# Patient Record
Sex: Female | Born: 1975 | Race: White | Hispanic: No | Marital: Single | Smoking: Current every day smoker
Health system: Southern US, Community
[De-identification: ages and names within clinical notes are randomized; demographics above are authoritative.]

## PROBLEM LIST (undated history)

## (undated) DIAGNOSIS — F32A Depression, unspecified: Secondary | ICD-10-CM

## (undated) DIAGNOSIS — F419 Anxiety disorder, unspecified: Secondary | ICD-10-CM

## (undated) DIAGNOSIS — F329 Major depressive disorder, single episode, unspecified: Secondary | ICD-10-CM

## (undated) DIAGNOSIS — N200 Calculus of kidney: Secondary | ICD-10-CM

## (undated) DIAGNOSIS — F4310 Post-traumatic stress disorder, unspecified: Secondary | ICD-10-CM

## (undated) DIAGNOSIS — N390 Urinary tract infection, site not specified: Secondary | ICD-10-CM

## (undated) HISTORY — PX: LAPAROSCOPIC ENDOMETRIOSIS FULGURATION: SUR769

## (undated) HISTORY — PX: TUBAL LIGATION: SHX77

## (undated) HISTORY — PX: HYSTEROSCOPY W/ ENDOMETRIAL ABLATION: SUR665

## (undated) HISTORY — PX: BILATERAL CARPAL TUNNEL RELEASE: SHX6508

## (undated) HISTORY — DX: Anxiety disorder, unspecified: F41.9

---

## 2001-12-12 ENCOUNTER — Emergency Department (HOSPITAL_COMMUNITY): Admission: EM | Admit: 2001-12-12 | Discharge: 2001-12-12 | Payer: Self-pay | Admitting: Emergency Medicine

## 2001-12-12 ENCOUNTER — Encounter: Payer: Self-pay | Admitting: Emergency Medicine

## 2002-02-19 ENCOUNTER — Emergency Department (HOSPITAL_COMMUNITY): Admission: EM | Admit: 2002-02-19 | Discharge: 2002-02-19 | Payer: Self-pay | Admitting: Internal Medicine

## 2002-04-10 ENCOUNTER — Emergency Department (HOSPITAL_COMMUNITY): Admission: EM | Admit: 2002-04-10 | Discharge: 2002-04-10 | Payer: Self-pay | Admitting: Emergency Medicine

## 2002-04-23 ENCOUNTER — Emergency Department (HOSPITAL_COMMUNITY): Admission: EM | Admit: 2002-04-23 | Discharge: 2002-04-24 | Payer: Self-pay | Admitting: *Deleted

## 2006-09-16 ENCOUNTER — Emergency Department (HOSPITAL_COMMUNITY): Admission: EM | Admit: 2006-09-16 | Discharge: 2006-09-16 | Payer: Self-pay | Admitting: Emergency Medicine

## 2006-10-27 ENCOUNTER — Emergency Department (HOSPITAL_COMMUNITY): Admission: EM | Admit: 2006-10-27 | Discharge: 2006-10-27 | Payer: Self-pay | Admitting: Emergency Medicine

## 2006-12-07 ENCOUNTER — Emergency Department (HOSPITAL_COMMUNITY): Admission: EM | Admit: 2006-12-07 | Discharge: 2006-12-07 | Payer: Self-pay | Admitting: Emergency Medicine

## 2007-05-24 ENCOUNTER — Emergency Department (HOSPITAL_COMMUNITY): Admission: EM | Admit: 2007-05-24 | Discharge: 2007-05-24 | Payer: Self-pay | Admitting: Emergency Medicine

## 2008-04-01 ENCOUNTER — Emergency Department (HOSPITAL_COMMUNITY): Admission: EM | Admit: 2008-04-01 | Discharge: 2008-04-01 | Payer: Self-pay | Admitting: Emergency Medicine

## 2008-12-08 ENCOUNTER — Emergency Department (HOSPITAL_COMMUNITY): Admission: EM | Admit: 2008-12-08 | Discharge: 2008-12-08 | Payer: Self-pay | Admitting: Emergency Medicine

## 2010-09-11 ENCOUNTER — Encounter: Payer: Self-pay | Admitting: *Deleted

## 2010-09-11 ENCOUNTER — Emergency Department (HOSPITAL_COMMUNITY)
Admission: EM | Admit: 2010-09-11 | Discharge: 2010-09-11 | Disposition: A | Discharge status: Home / Self Care | Payer: Self-pay | Attending: Emergency Medicine | Admitting: Emergency Medicine

## 2010-09-11 DIAGNOSIS — N739 Female pelvic inflammatory disease, unspecified: Secondary | ICD-10-CM | POA: Insufficient documentation

## 2010-09-11 DIAGNOSIS — N390 Urinary tract infection, site not specified: Secondary | ICD-10-CM | POA: Insufficient documentation

## 2010-09-11 DIAGNOSIS — H00019 Hordeolum externum unspecified eye, unspecified eyelid: Secondary | ICD-10-CM | POA: Insufficient documentation

## 2010-09-11 DIAGNOSIS — R109 Unspecified abdominal pain: Secondary | ICD-10-CM | POA: Insufficient documentation

## 2010-09-11 DIAGNOSIS — N898 Other specified noninflammatory disorders of vagina: Secondary | ICD-10-CM | POA: Insufficient documentation

## 2010-09-11 DIAGNOSIS — H5710 Ocular pain, unspecified eye: Secondary | ICD-10-CM | POA: Insufficient documentation

## 2010-09-11 LAB — URINALYSIS, ROUTINE W REFLEX MICROSCOPIC
Bilirubin Urine: NEGATIVE
Glucose, UA: NEGATIVE mg/dL
Hgb urine dipstick: NEGATIVE
Ketones, ur: NEGATIVE mg/dL
Leukocytes, UA: NEGATIVE
Nitrite: NEGATIVE
Protein, ur: NEGATIVE mg/dL
Specific Gravity, Urine: 1.02 (ref 1.005–1.030)
Urobilinogen, UA: 0.2 mg/dL (ref 0.0–1.0)
pH: 7.5 (ref 5.0–8.0)

## 2010-09-11 LAB — POCT PREGNANCY, URINE: Preg Test, Ur: NEGATIVE

## 2010-09-11 LAB — WET PREP, GENITAL: Yeast Wet Prep HPF POC: NONE SEEN

## 2010-09-11 MED ORDER — DOXYCYCLINE HYCLATE 100 MG PO CAPS: 100.0000 mg | ORAL_CAPSULE | Freq: Two times a day (BID) | ORAL | Status: AC

## 2010-09-11 MED ORDER — METRONIDAZOLE 500 MG PO TABS: 500.0000 mg | ORAL_TABLET | Freq: Two times a day (BID) | ORAL | Status: AC

## 2010-09-11 MED ORDER — HYDROCODONE-ACETAMINOPHEN 5-325 MG PO TABS: ORAL_TABLET | ORAL | Status: AC

## 2010-09-11 MED ORDER — CEFTRIAXONE SODIUM 1 G IJ SOLR
500.0000 mg | Freq: Once | INTRAMUSCULAR | Status: AC
  Administered 2010-09-11: 500 mg via INTRAMUSCULAR

## 2010-09-11 MED ORDER — TOBRAMYCIN 0.3 % OP SOLN
2.0000 [drp] | Freq: Once | OPHTHALMIC | Status: AC
  Administered 2010-09-11: 2 [drp] via OPHTHALMIC

## 2010-09-11 NOTE — ED Notes (Signed)
Set pt up for pelvic exam.

## 2010-09-11 NOTE — ED Notes (Signed)
Pt c/o lower abd pain and dark urine also vaginal discharge. Pt c/o right eye irritation with drainage.

## 2010-09-11 NOTE — ED Notes (Signed)
Pt reports lower abd pain for the past few days.  Denies any n/v/d.  Reports has had a white colored vaginal discharge but says is not new.  LBM was this am and was normal per pt.  Abd soft, bowel sounds present.  PT also c/o pain in R eye for the past couple of days.

## 2010-09-11 NOTE — ED Provider Notes (Signed)
History     CSN: [id number] Arrival date & time: 09/11/2010 11:05 AM  Chief Complaint  Patient presents with  . Eye Pain    right eye  . Urinary Tract Infection    possible, urine dark  . Abdominal Pain    lower abd pain   HPI Comments: Patient c/o crampy type lower abd pain for several days.  Does not recall anything that makes it worse.  Also c/o recurrent vagianl discharge that is heavier at times.  She denies fever N/V/D.  Also denies dysuria sx's but does state that her urines has been darker than ususal and she also reports pain and swelling to her right lower eyelid for 2 days.  She denies visual changes or crusting of her eye.  Patient is a 35 y.o. female presenting with abdominal pain. The history is provided by the patient.  Abdominal Pain The primary symptoms of the illness include abdominal pain and vaginal discharge. The primary symptoms of the illness do not include fever, shortness of breath, nausea, vomiting, diarrhea, hematochezia, dysuria or vaginal bleeding. The current episode started more than 2 days ago. The onset of the illness was gradual. The problem has not changed since onset. The abdominal pain is located in the suprapubic region. The abdominal pain does not radiate. The abdominal pain is relieved by nothing. Exacerbated by: nothing.  The vaginal discharge was first noticed more than 2 days ago. Vaginal discharge is a recurrent problem. The patient believes that the vaginal discharge is unchanged since it began. The amount of discharge is variable. The color of the discharge is white. The vaginal discharge is associated with burning. The vaginal discharge is not associated with itching or dysuria.  The patient states that she believes she is currently not pregnant. The patient has not had a change in bowel habit. Symptoms associated with the illness do not include chills, anorexia, constipation, urgency, hematuria, frequency or back pain. Significant associated medical  issues do not include diabetes.    Past Medical History  Diagnosis Date  . Endometriosis     Past Surgical History  Procedure Date  . Laparoscopic endometriosis fulguration     History reviewed. No pertinent family history.  History  Substance Use Topics  . Smoking status: Current Everyday Smoker -- 1.0 packs/day  . Smokeless tobacco: Not on file  . Alcohol Use: Yes     occationally    OB History    Grav Para Term Preterm Abortions TAB SAB Ect Mult Living                  Review of Systems  Constitutional: Negative for fever and chills.  Eyes: Positive for pain. Negative for photophobia, discharge, redness, itching and visual disturbance.  Respiratory: Negative for chest tightness and shortness of breath.   Gastrointestinal: Positive for abdominal pain. Negative for nausea, vomiting, diarrhea, constipation, hematochezia and anorexia.  Genitourinary: Positive for vaginal discharge and pelvic pain. Negative for dysuria, urgency, frequency, hematuria and vaginal bleeding.  Musculoskeletal: Negative for back pain.  Skin: Negative for itching.  All other systems reviewed and are negative.    Physical Exam  BP 92/64  Pulse 92  Temp(Src) 98 F (36.7 C) (Oral)  Resp 18  Ht 4\' 11"  (1.499 m)  Wt 115 lb (52.164 kg)  BMI 23.23 kg/m2  SpO2 100%  LMP 08/28/2010  Physical Exam  Nursing note and vitals reviewed. Constitutional: She is oriented to person, place, and time. She appears well-developed and well-nourished. No  distress.  HENT:  Head: Normocephalic and atraumatic.  Right Ear: External ear normal.  Left Ear: External ear normal.  Mouth/Throat: Oropharynx is clear and moist.  Eyes: EOM are normal. Pupils are equal, round, and reactive to light. Right eye exhibits hordeolum. Right conjunctiva is not injected. Right conjunctiva has no hemorrhage.    Neck: Normal range of motion. Neck supple.  Cardiovascular: Normal rate, regular rhythm and normal heart sounds.     Abdominal: Soft. She exhibits no distension.  Genitourinary: Uterus normal. Cervix exhibits motion tenderness and discharge. Cervix exhibits no friability. Right adnexum displays no mass and no tenderness. Left adnexum displays no mass and no tenderness. No tenderness or bleeding around the vagina. Vaginal discharge found.  Musculoskeletal: She exhibits no edema and no tenderness.  Lymphadenopathy:    She has no cervical adenopathy.  Neurological: She is alert and oriented to person, place, and time.  Skin: Skin is warm and dry.  Psychiatric: She has a normal mood and affect.    ED Course  Procedures  MDM   1315  GC and chlamydia culture pending.  Pt is ambulatory, NAD.  Vitals are stable.  Non-toxic appearing.  Pt has CMT and mild to moderate vaginal d/c.  No adnexal tenderness or masses on exam.  Will treat for PID.   Pt has appt with her PMD for Monday.        Jannet Calip L. [place], [place] 09/19/10 [phone]

## 2010-09-21 NOTE — ED Provider Notes (Signed)
Medical screening examination/treatment/procedure(s) were performed by non-physician practitioner and as supervising physician I was immediately available for consultation/collaboration.   Lyanne Co, MD 09/21/10 [phone]

## 2010-10-28 LAB — URINALYSIS, ROUTINE W REFLEX MICROSCOPIC
Bilirubin Urine: NEGATIVE
Glucose, UA: NEGATIVE
Hgb urine dipstick: NEGATIVE
Ketones, ur: NEGATIVE
Nitrite: NEGATIVE
Protein, ur: NEGATIVE
Specific Gravity, Urine: 1.015
Urobilinogen, UA: 0.2
pH: 7

## 2010-10-28 LAB — PREGNANCY, URINE: Preg Test, Ur: NEGATIVE

## 2010-10-28 LAB — GC/CHLAMYDIA PROBE AMP, GENITAL
Chlamydia, DNA Probe: NEGATIVE
GC Probe Amp, Genital: NEGATIVE

## 2010-10-28 LAB — WET PREP, GENITAL
Clue Cells Wet Prep HPF POC: NONE SEEN
Trich, Wet Prep: NONE SEEN
Yeast Wet Prep HPF POC: NONE SEEN

## 2010-11-13 LAB — URINALYSIS, ROUTINE W REFLEX MICROSCOPIC
Bilirubin Urine: NEGATIVE
Glucose, UA: NEGATIVE
Nitrite: NEGATIVE
Specific Gravity, Urine: 1.01
pH: 6.5

## 2010-11-13 LAB — PREGNANCY, URINE: Preg Test, Ur: NEGATIVE

## 2010-11-13 LAB — GC/CHLAMYDIA PROBE AMP, GENITAL: Chlamydia, DNA Probe: NEGATIVE

## 2010-11-17 LAB — COMPREHENSIVE METABOLIC PANEL
Albumin: 4.3
Alkaline Phosphatase: 67
BUN: 6
CO2: 29
Chloride: 103
Glucose, Bld: 96
Potassium: 4.2
Total Bilirubin: 0.5

## 2010-11-17 LAB — DIFFERENTIAL
Basophils Absolute: 0
Basophils Relative: 1
Monocytes Absolute: 0.7
Neutro Abs: 3.3
Neutrophils Relative %: 48

## 2010-11-17 LAB — CBC
HCT: 38.1
Hemoglobin: 13.1
WBC: 6.9

## 2010-11-17 LAB — URINALYSIS, ROUTINE W REFLEX MICROSCOPIC
Bilirubin Urine: NEGATIVE
Ketones, ur: NEGATIVE
Nitrite: NEGATIVE
Specific Gravity, Urine: 1.025
Urobilinogen, UA: 0.2

## 2010-11-17 LAB — WET PREP, GENITAL

## 2010-11-17 LAB — PREGNANCY, URINE: Preg Test, Ur: NEGATIVE

## 2011-07-20 ENCOUNTER — Emergency Department (HOSPITAL_COMMUNITY)
Admission: EM | Admit: 2011-07-20 | Discharge: 2011-07-21 | Disposition: A | Discharge status: Home / Self Care | Payer: Self-pay | Attending: Emergency Medicine | Admitting: Emergency Medicine

## 2011-07-20 ENCOUNTER — Emergency Department (HOSPITAL_COMMUNITY): Payer: Self-pay

## 2011-07-20 ENCOUNTER — Encounter (HOSPITAL_COMMUNITY): Payer: Self-pay | Admitting: *Deleted

## 2011-07-20 DIAGNOSIS — R300 Dysuria: Secondary | ICD-10-CM | POA: Insufficient documentation

## 2011-07-20 DIAGNOSIS — F17200 Nicotine dependence, unspecified, uncomplicated: Secondary | ICD-10-CM | POA: Insufficient documentation

## 2011-07-20 DIAGNOSIS — N200 Calculus of kidney: Secondary | ICD-10-CM | POA: Insufficient documentation

## 2011-07-20 DIAGNOSIS — R102 Pelvic and perineal pain: Secondary | ICD-10-CM

## 2011-07-20 DIAGNOSIS — R109 Unspecified abdominal pain: Secondary | ICD-10-CM | POA: Insufficient documentation

## 2011-07-20 DIAGNOSIS — N949 Unspecified condition associated with female genital organs and menstrual cycle: Secondary | ICD-10-CM | POA: Insufficient documentation

## 2011-07-20 DIAGNOSIS — R0602 Shortness of breath: Secondary | ICD-10-CM | POA: Insufficient documentation

## 2011-07-20 DIAGNOSIS — N898 Other specified noninflammatory disorders of vagina: Secondary | ICD-10-CM | POA: Insufficient documentation

## 2011-07-20 LAB — CBC
HCT: 38.9 % (ref 36.0–46.0)
Hemoglobin: 13.1 g/dL (ref 12.0–15.0)
MCH: 31.3 pg (ref 26.0–34.0)
MCHC: 33.7 g/dL (ref 30.0–36.0)
MCV: 93.1 fL (ref 78.0–100.0)
RBC: 4.18 MIL/uL (ref 3.87–5.11)

## 2011-07-20 LAB — WET PREP, GENITAL: Yeast Wet Prep HPF POC: NONE SEEN

## 2011-07-20 LAB — URINALYSIS, ROUTINE W REFLEX MICROSCOPIC
Bilirubin Urine: NEGATIVE
Nitrite: NEGATIVE
Specific Gravity, Urine: 1.02 (ref 1.005–1.030)
Urobilinogen, UA: 0.2 mg/dL (ref 0.0–1.0)

## 2011-07-20 LAB — DIFFERENTIAL
Basophils Relative: 0 % (ref 0–1)
Eosinophils Absolute: 0.1 10*3/uL (ref 0.0–0.7)
Eosinophils Relative: 2 % (ref 0–5)
Lymphs Abs: 2.2 10*3/uL (ref 0.7–4.0)
Monocytes Absolute: 0.6 10*3/uL (ref 0.1–1.0)
Monocytes Relative: 11 % (ref 3–12)

## 2011-07-20 LAB — BASIC METABOLIC PANEL
BUN: 16 mg/dL (ref 6–23)
Calcium: 9.6 mg/dL (ref 8.4–10.5)
Creatinine, Ser: 0.56 mg/dL (ref 0.50–1.10)
GFR calc Af Amer: >90 mL/min (ref >90)
GFR calc non Af Amer: >90 mL/min (ref >90)
Glucose, Bld: 102 mg/dL — ABNORMAL HIGH (ref 70–99)
Potassium: 3.9 mEq/L (ref 3.5–5.1)

## 2011-07-20 MED ORDER — HYDROMORPHONE HCL PF 1 MG/ML IJ SOLN
1.0000 mg | Freq: Once | INTRAMUSCULAR | Status: AC
  Administered 2011-07-20: 1 mg via INTRAVENOUS
  Filled 2011-07-20: qty 1

## 2011-07-20 MED ORDER — IOHEXOL 300 MG/ML  SOLN
100.0000 mL | Freq: Once | INTRAMUSCULAR | Status: AC | PRN
  Administered 2011-07-20: 100 mL via INTRAVENOUS

## 2011-07-20 MED ORDER — SODIUM CHLORIDE 0.9 % IV BOLUS (SEPSIS)
250.0000 mL | Freq: Once | INTRAVENOUS | Status: AC
  Administered 2011-07-20: 250 mL via INTRAVENOUS

## 2011-07-20 MED ORDER — SODIUM CHLORIDE 0.9 % IV SOLN: INTRAVENOUS | Status: DC

## 2011-07-20 MED ORDER — ONDANSETRON HCL 4 MG/2ML IJ SOLN
4.0000 mg | Freq: Once | INTRAMUSCULAR | Status: AC
  Administered 2011-07-20: 4 mg via INTRAVENOUS
  Filled 2011-07-20: qty 2

## 2011-07-20 NOTE — ED Provider Notes (Signed)
History     CSN: [id number]  Arrival date & time 07/20/11  1439   First MD Initiated Contact with Patient 07/20/11 1810      Chief Complaint  Patient presents with  . Abdominal Pain    (Consider location/radiation/quality/duration/timing/severity/associated sxs/prior treatment) Patient is a 36 y.o. female presenting with abdominal pain. The history is provided by the patient.  Abdominal Pain The primary symptoms of the illness include abdominal pain, shortness of breath, dysuria and vaginal discharge. The primary symptoms of the illness do not include fever, fatigue, nausea, vomiting, diarrhea or vaginal bleeding. The current episode started more than 2 days ago. The onset of the illness was sudden. The problem has been gradually worsening.  The abdominal pain began more than 2 days ago (abdominal pain for 3-4 days.). The abdominal pain is located in the suprapubic region, RLQ and LLQ. The abdominal pain radiates to the back. The severity of the abdominal pain is 8/10. The abdominal pain is relieved by nothing.  The dysuria began 3 to 5 days ago. The discomfort is felt in the suprapubic area. The dysuria is associated with discharge. The dysuria is not associated with hematuria.  The vaginal discharge is associated with dysuria.   The patient states that she believes she is currently not pregnant. Symptoms associated with the illness do not include hematuria or back pain.  past medical history significant for endometriosis.  Past Medical History  Diagnosis Date  . Endometriosis     Past Surgical History  Procedure Date  . Laparoscopic endometriosis fulguration   . Cesarean section   . Tubal ligation     History reviewed. No pertinent family history.  History  Substance Use Topics  . Smoking status: Current Everyday Smoker -- 1.0 packs/day  . Smokeless tobacco: Not on file  . Alcohol Use: Yes     occationally    OB History    Grav Para Term Preterm Abortions TAB SAB Ect  Mult Living                  Review of Systems  Constitutional: Negative for fever and fatigue.  HENT: Negative for neck pain.   Respiratory: Positive for shortness of breath. Negative for cough.   Cardiovascular: Negative for chest pain.  Gastrointestinal: Positive for abdominal pain. Negative for nausea, vomiting and diarrhea.  Genitourinary: Positive for dysuria and vaginal discharge. Negative for hematuria and vaginal bleeding.  Musculoskeletal: Negative for back pain.  Skin: Negative for rash.  Neurological: Negative for headaches.  Hematological: Does not bruise/bleed easily.    Allergies  Review of patient's allergies indicates no known allergies.  Home Medications   Current Outpatient Rx  Name Route Sig Dispense Refill  . IBUPROFEN 200 MG PO TABS Oral Take 600 mg by mouth every 6 (six) hours as needed. FOR PAIN     . NAPROXEN SODIUM 220 MG PO CAPS Oral Take 440 mg by mouth as needed. For pain    . HYDROCODONE-ACETAMINOPHEN 5-325 MG PO TABS Oral Take 1 tablet by mouth every 4 (four) hours as needed for pain. 10 tablet 0    BP 94/55  Pulse 63  Temp 98.1 F (36.7 C) (Oral)  Resp 16  Ht 4\' 11"  (1.499 m)  Wt 110 lb (49.896 kg)  BMI 22.22 kg/m2  SpO2 100%  LMP 07/11/2011  Physical Exam  Nursing note and vitals reviewed. Constitutional: She is oriented to person, place, and time. She appears well-developed and well-nourished. No distress.  HENT:  Head: Normocephalic and atraumatic.  Mouth/Throat: Oropharynx is clear and moist.  Eyes: Conjunctivae and EOM are normal. Pupils are equal, round, and reactive to light.  Neck: Normal range of motion. Neck supple.  Cardiovascular: Normal rate, regular rhythm and normal heart sounds.   No murmur heard. Pulmonary/Chest: Effort normal and breath sounds normal.  Abdominal: Soft. Bowel sounds are normal. There is no tenderness.  Genitourinary: Vaginal discharge found.       On vaginal exam external genitalia is normal no  bleeding slight cloudy discharge not purulent not milky mild cervical motion tenderness mild uterine discomfort no adnexal discomfort.  Musculoskeletal: Normal range of motion. She exhibits no edema.  Neurological: She is alert and oriented to person, place, and time. No cranial nerve deficit. She exhibits normal muscle tone. Coordination normal.  Skin: Skin is warm. No rash noted.    ED Course  Procedures (including critical care time)  Labs Reviewed  BASIC METABOLIC PANEL - Abnormal; Notable for the following:    Glucose, Bld 102 (*)     All other components within normal limits  WET PREP, GENITAL - Abnormal; Notable for the following:    Clue Cells Wet Prep HPF POC FEW (*)     WBC, Wet Prep HPF POC FEW (*)     All other components within normal limits  URINALYSIS, ROUTINE W REFLEX MICROSCOPIC  PREGNANCY, URINE  CBC  DIFFERENTIAL  GC/CHLAMYDIA PROBE AMP, GENITAL   Ct Abdomen Pelvis W Contrast  07/20/2011  *RADIOLOGY REPORT*  Clinical Data: Diffuse abdominal pain.  Endometriosis.  CT ABDOMEN AND PELVIS WITH CONTRAST  Technique:  Multidetector CT imaging of the abdomen and pelvis was performed following the standard protocol during bolus administration of intravenous contrast.  Contrast: OMNIPAQUE IOHEXOL 300 MG/ML  SOLN  Comparison: None.  Findings: The abdominal parenchymal organs are normal in appearance.  No soft tissue masses or lymphadenopathy identified within the abdomen or pelvis.  Gallbladder is unremarkable.  A tiny less than 5 mm nonobstructing calculus is seen in the mid pole of the left kidney, however there is no evidence of ureteral calculi or hydronephrosis.  Uterus and adnexa are unremarkable in appearance.  No evidence of inflammatory process or abnormal fluid collections within the abdomen or pelvis.  No evidence of bowel wall thickening or dilatation. Normal appendix is visualized.  IMPRESSION:  1.  Tiny nonobstructing left intrarenal calculus. 2.  No evidence of  ureteral calculus, hydronephrosis, or other acute findings.  Original Report Authenticated By: Danae Orleans, M.D.    Results for orders placed during the hospital encounter of 07/20/11  URINALYSIS, ROUTINE W REFLEX MICROSCOPIC      Component Value Range   Color, Urine YELLOW  YELLOW   APPearance CLEAR  CLEAR   Specific Gravity, Urine 1.020  1.005 - 1.030   pH 6.0  5.0 - 8.0   Glucose, UA NEGATIVE  NEGATIVE mg/dL   Hgb urine dipstick NEGATIVE  NEGATIVE   Bilirubin Urine NEGATIVE  NEGATIVE   Ketones, ur NEGATIVE  NEGATIVE mg/dL   Protein, ur NEGATIVE  NEGATIVE mg/dL   Urobilinogen, UA 0.2  0.0 - 1.0 mg/dL   Nitrite NEGATIVE  NEGATIVE   Leukocytes, UA NEGATIVE  NEGATIVE  PREGNANCY, URINE      Component Value Range   Preg Test, Ur NEGATIVE  NEGATIVE  CBC      Component Value Range   WBC 5.1  4.0 - 10.5 K/uL   RBC 4.18  3.87 - 5.11 MIL/uL  Hemoglobin 13.1  12.0 - 15.0 g/dL   HCT 82.9  56.2 - 13.0 %   MCV 93.1  78.0 - 100.0 fL   MCH 31.3  26.0 - 34.0 pg   MCHC 33.7  30.0 - 36.0 g/dL   RDW 86.5  78.4 - 69.6 %   Platelets 282  150 - 400 K/uL  DIFFERENTIAL      Component Value Range   Neutrophils Relative 44  43 - 77 %   Neutro Abs 2.3  1.7 - 7.7 K/uL   Lymphocytes Relative 43  12 - 46 %   Lymphs Abs 2.2  0.7 - 4.0 K/uL   Monocytes Relative 11  3 - 12 %   Monocytes Absolute 0.6  0.1 - 1.0 K/uL   Eosinophils Relative 2  0 - 5 %   Eosinophils Absolute 0.1  0.0 - 0.7 K/uL   Basophils Relative 0  0 - 1 %   Basophils Absolute 0.0  0.0 - 0.1 K/uL  BASIC METABOLIC PANEL      Component Value Range   Sodium 138  135 - 145 mEq/L   Potassium 3.9  3.5 - 5.1 mEq/L   Chloride 102  96 - 112 mEq/L   CO2 25  19 - 32 mEq/L   Glucose, Bld 102 (*) 70 - 99 mg/dL   BUN 16  6 - 23 mg/dL   Creatinine, Ser 2.95  0.50 - 1.10 mg/dL   Calcium 9.6  8.4 - 28.4 mg/dL   GFR calc non Af Amer >90  >90 mL/min   GFR calc Af Amer >90  >90 mL/min  WET PREP, GENITAL      Component Value Range   Yeast Wet  Prep HPF POC NONE SEEN  NONE SEEN   Trich, Wet Prep NONE SEEN  NONE SEEN   Clue Cells Wet Prep HPF POC FEW (*) NONE SEEN   WBC, Wet Prep HPF POC FEW (*) NONE SEEN    1. Pelvic pain       MDM  Patient presents with complaint of frequency of urination with bad odor pain low abdomen and back no nausea no vomiting no diarrhea urinalysis was negative for urinary tract infection on pelvic exam there was mild cervical motion tenderness and uterine tenderness but no adnexal tenderness very mild not exactly like PID. CT scan was order was originally planned to do a pelvic ultrasound that was not available CT scan is pending to rule out any other abdominal process or pelvic process. Patient does have a past history of endometriosis this may be related to that. Patient's wet prep without any specific findings urinalysis as stated not consistent with urinary tract infection no leukocytosis pregnancy test was negative.  If the CT scan is negative patient can be discharged home with a small amount of pain medicine and followup with [facility] for further evaluation.a few clue cells noted but do not feel that this is significant requiring treatment. Pelvis without a significant discharge and no bleeding.        Shelda Jakes, MD 07/21/11 1325

## 2011-07-20 NOTE — ED Notes (Signed)
Frequency of urination, with bad odor,  Pain low abd and back.  No nausea,  No diarrhea.Marland Kitchen

## 2011-07-21 MED ORDER — HYDROCODONE-ACETAMINOPHEN 5-325 MG PO TABS: 1.0000 | ORAL_TABLET | ORAL | Status: AC | PRN

## 2011-07-21 NOTE — Discharge Instructions (Signed)
Apply heat to the lower abdomen for comfort. Use the pain medicine as needed. Follow up with OB/GYN. One is listed for you on the paperwork.   Abdominal Pain, Women Abdominal (stomach, pelvic, or belly) pain can be caused by many things. It is important to tell your doctor:  The location of the pain.   Does it come and go or is it present all the time?   Are there things that start the pain (eating certain foods, exercise)?   Are there other symptoms associated with the pain (fever, nausea, vomiting, diarrhea)?  All of this is helpful to know when trying to find the cause of the pain. CAUSES   Stomach: virus or bacteria infection, or ulcer.   Intestine: appendicitis (inflamed appendix), regional ileitis (Crohn's disease), ulcerative colitis (inflamed colon), irritable bowel syndrome, diverticulitis (inflamed diverticulum of the colon), or cancer of the stomach or intestine.   Gallbladder disease or stones in the gallbladder.   Kidney disease, kidney stones, or infection.   Pancreas infection or cancer.   Fibromyalgia (pain disorder).   Diseases of the female organs:   Uterus: fibroid (non-cancerous) tumors or infection.   Fallopian tubes: infection or tubal pregnancy.   Ovary: cysts or tumors.   Pelvic adhesions (scar tissue).   Endometriosis (uterus lining tissue growing in the pelvis and on the pelvic organs).   Pelvic congestion syndrome (female organs filling up with blood just before the menstrual period).   Pain with the menstrual period.   Pain with ovulation (producing an egg).   Pain with an IUD (intrauterine device, birth control) in the uterus.   Cancer of the female organs.   Functional pain (pain not caused by a disease, may improve without treatment).   Psychological pain.   Depression.  DIAGNOSIS  Your doctor will decide the seriousness of your pain by doing an examination.  Blood tests.   X-rays.   Ultrasound.   CT scan (computed  tomography, special type of X-ray).   MRI (magnetic resonance imaging).   Cultures, for infection.   Barium enema (dye inserted in the large intestine, to better view it with X-rays).   Colonoscopy (looking in intestine with a lighted tube).   Laparoscopy (minor surgery, looking in abdomen with a lighted tube).   Major abdominal exploratory surgery (looking in abdomen with a large incision).  TREATMENT  The treatment will depend on the cause of the pain.   Many cases can be observed and treated at home.   Over-the-counter medicines recommended by your caregiver.   Prescription medicine.   Antibiotics, for infection.   Birth control pills, for painful periods or for ovulation pain.   Hormone treatment, for endometriosis.   Nerve blocking injections.   Physical therapy.   Antidepressants.   Counseling with a psychologist or psychiatrist.   Minor or major surgery.  HOME CARE INSTRUCTIONS   Do not take laxatives, unless directed by your caregiver.   Take over-the-counter pain medicine only if ordered by your caregiver. Do not take aspirin because it can cause an upset stomach or bleeding.   Try a clear liquid diet (broth or water) as ordered by your caregiver. Slowly move to a bland diet, as tolerated, if the pain is related to the stomach or intestine.   Have a thermometer and take your temperature several times a day, and record it.   Bed rest and sleep, if it helps the pain.   Avoid sexual intercourse, if it causes pain.   Avoid stressful  situations.   Keep your follow-up appointments and tests, as your caregiver orders.   If the pain does not go away with medicine or surgery, you may try:   Acupuncture.   Relaxation exercises (yoga, meditation).   Group therapy.   Counseling.  SEEK MEDICAL CARE IF:   You notice certain foods cause stomach pain.   Your home care treatment is not helping your pain.   You need stronger pain medicine.   You want  your IUD removed.   You feel faint or lightheaded.   You develop nausea and vomiting.   You develop a rash.   You are having side effects or an allergy to your medicine.  SEEK IMMEDIATE MEDICAL CARE IF:   Your pain does not go away or gets worse.   You have a fever.   Your pain is felt only in portions of the abdomen. The right side could possibly be appendicitis. The left lower portion of the abdomen could be colitis or diverticulitis.   You are passing blood in your stools (bright red or black tarry stools, with or without vomiting).   You have blood in your urine.   You develop chills, with or without a fever.   You pass out.  MAKE SURE YOU:   Understand these instructions.   Will watch your condition.   Will get help right away if you are not doing well or get worse.  Document Released: 11/16/2006 Document Revised: 01/08/2011 Document Reviewed: 12/06/2008 [facility] Patient Information 2012 [place], [place].

## 2011-07-21 NOTE — Progress Notes (Signed)
2300 Assumed care/disposition of patient with lower abdominal pain  Associated with urinary frequency.Awaiting CT abdomen and pelvis. Labs are unremarkable. Urine with a few clue cells.  1610 CT scan is negative for any acute process. Reviewed results with the patient. Her pain is better. She asked for work note and Rx for analgesics. Referral to OB/.GYN. Pt stable in ED with no significant deterioration in condition.The patient appears reasonably stabilized for admission considering the current resources, flow, and capabilities available in the ED at this time, and I doubt any other [facility] requiring further screening and/or treatment in the ED prior to admission.  Results for orders placed during the hospital encounter of 07/20/11  URINALYSIS, ROUTINE W REFLEX MICROSCOPIC      Component Value Range   Color, Urine YELLOW  YELLOW   APPearance CLEAR  CLEAR   Specific Gravity, Urine 1.020  1.005 - 1.030   pH 6.0  5.0 - 8.0   Glucose, UA NEGATIVE  NEGATIVE mg/dL   Hgb urine dipstick NEGATIVE  NEGATIVE   Bilirubin Urine NEGATIVE  NEGATIVE   Ketones, ur NEGATIVE  NEGATIVE mg/dL   Protein, ur NEGATIVE  NEGATIVE mg/dL   Urobilinogen, UA 0.2  0.0 - 1.0 mg/dL   Nitrite NEGATIVE  NEGATIVE   Leukocytes, UA NEGATIVE  NEGATIVE  PREGNANCY, URINE      Component Value Range   Preg Test, Ur NEGATIVE  NEGATIVE  CBC      Component Value Range   WBC 5.1  4.0 - 10.5 K/uL   RBC 4.18  3.87 - 5.11 MIL/uL   Hemoglobin 13.1  12.0 - 15.0 g/dL   HCT 96.0  45.4 - 09.8 %   MCV 93.1  78.0 - 100.0 fL   MCH 31.3  26.0 - 34.0 pg   MCHC 33.7  30.0 - 36.0 g/dL   RDW 11.9  14.7 - 82.9 %   Platelets 282  150 - 400 K/uL  DIFFERENTIAL      Component Value Range   Neutrophils Relative 44  43 - 77 %   Neutro Abs 2.3  1.7 - 7.7 K/uL   Lymphocytes Relative 43  12 - 46 %   Lymphs Abs 2.2  0.7 - 4.0 K/uL   Monocytes Relative 11  3 - 12 %   Monocytes Absolute 0.6  0.1 - 1.0 K/uL   Eosinophils Relative 2  0 - 5 %   Eosinophils  Absolute 0.1  0.0 - 0.7 K/uL   Basophils Relative 0  0 - 1 %   Basophils Absolute 0.0  0.0 - 0.1 K/uL  BASIC METABOLIC PANEL      Component Value Range   Sodium 138  135 - 145 mEq/L   Potassium 3.9  3.5 - 5.1 mEq/L   Chloride 102  96 - 112 mEq/L   CO2 25  19 - 32 mEq/L   Glucose, Bld 102 (*) 70 - 99 mg/dL   BUN 16  6 - 23 mg/dL   Creatinine, Ser 5.62  0.50 - 1.10 mg/dL   Calcium 9.6  8.4 - 13.0 mg/dL   GFR calc non Af Amer >90  >90 mL/min   GFR calc Af Amer >90  >90 mL/min  WET PREP, GENITAL      Component Value Range   Yeast Wet Prep HPF POC NONE SEEN  NONE SEEN   Trich, Wet Prep NONE SEEN  NONE SEEN   Clue Cells Wet Prep HPF POC FEW (*) NONE SEEN   WBC,  Wet Prep HPF POC FEW (*) NONE SEEN    Ct Abdomen Pelvis W Contrast  07/20/2011  *RADIOLOGY REPORT*  Clinical Data: Diffuse abdominal pain.  Endometriosis.  CT ABDOMEN AND PELVIS WITH CONTRAST  Technique:  Multidetector CT imaging of the abdomen and pelvis was performed following the standard protocol during bolus administration of intravenous contrast.  Contrast: OMNIPAQUE IOHEXOL 300 MG/ML  SOLN  Comparison: None.  Findings: The abdominal parenchymal organs are normal in appearance.  No soft tissue masses or lymphadenopathy identified within the abdomen or pelvis.  Gallbladder is unremarkable.  A tiny less than 5 mm nonobstructing calculus is seen in the mid pole of the left kidney, however there is no evidence of ureteral calculi or hydronephrosis.  Uterus and adnexa are unremarkable in appearance.  No evidence of inflammatory process or abnormal fluid collections within the abdomen or pelvis.  No evidence of bowel wall thickening or dilatation. Normal appendix is visualized.  IMPRESSION:  1.  Tiny nonobstructing left intrarenal calculus. 2.  No evidence of ureteral calculus, hydronephrosis, or other acute findings.  Original Report Authenticated By: Danae Orleans, M.D.

## 2011-07-21 NOTE — ED Notes (Addendum)
Left in c/o husband for transport home; instructions reviewed and f/u information provided; verbalizes understanding; alert, in no distress. Ambulatory with steady gait.

## 2011-08-01 ENCOUNTER — Encounter (HOSPITAL_COMMUNITY): Payer: Self-pay | Admitting: Emergency Medicine

## 2011-08-01 ENCOUNTER — Emergency Department (HOSPITAL_COMMUNITY)
Admission: EM | Admit: 2011-08-01 | Discharge: 2011-08-01 | Disposition: A | Discharge status: Home / Self Care | Payer: No Typology Code available for payment source | Attending: Emergency Medicine | Admitting: Emergency Medicine

## 2011-08-01 DIAGNOSIS — F17200 Nicotine dependence, unspecified, uncomplicated: Secondary | ICD-10-CM | POA: Insufficient documentation

## 2011-08-01 DIAGNOSIS — IMO0002 Reserved for concepts with insufficient information to code with codable children: Secondary | ICD-10-CM | POA: Insufficient documentation

## 2011-08-01 LAB — POCT PREGNANCY, URINE: Preg Test, Ur: NEGATIVE

## 2011-08-01 MED ORDER — PROMETHAZINE HCL 12.5 MG PO TABS
25.0000 mg | ORAL_TABLET | Freq: Four times a day (QID) | ORAL | Status: DC | PRN
  Filled 2011-08-01: qty 2

## 2011-08-01 MED ORDER — IBUPROFEN 400 MG PO TABS
400.0000 mg | ORAL_TABLET | Freq: Once | ORAL | Status: AC
  Administered 2011-08-01: 400 mg via ORAL

## 2011-08-01 MED ORDER — METRONIDAZOLE 500 MG PO TABS
2000.0000 mg | ORAL_TABLET | Freq: Once | ORAL | Status: AC
  Administered 2011-08-01: 2000 mg via ORAL
  Filled 2011-08-01: qty 4

## 2011-08-01 MED ORDER — CIPROFLOXACIN HCL 250 MG PO TABS
500.0000 mg | ORAL_TABLET | Freq: Once | ORAL | Status: DC
  Filled 2011-08-01: qty 2

## 2011-08-01 MED ORDER — CEFIXIME 400 MG PO TABS
ORAL_TABLET | ORAL | Status: AC
  Administered 2011-08-01: 400 mg via ORAL
  Filled 2011-08-01: qty 1

## 2011-08-01 MED ORDER — LORAZEPAM 1 MG PO TABS
1.0000 mg | ORAL_TABLET | Freq: Once | ORAL | Status: AC
  Administered 2011-08-01: 1 mg via ORAL
  Filled 2011-08-01: qty 1

## 2011-08-01 MED ORDER — AZITHROMYCIN 1 G PO PACK
PACK | ORAL | Status: AC
  Administered 2011-08-01: 1 g via ORAL
  Filled 2011-08-01: qty 1

## 2011-08-01 MED ORDER — METRONIDAZOLE 500 MG PO TABS
ORAL_TABLET | ORAL | Status: AC
  Administered 2011-08-01: 2000 mg via ORAL
  Filled 2011-08-01: qty 4

## 2011-08-01 MED ORDER — PROMETHAZINE HCL 25 MG PO TABS
ORAL_TABLET | ORAL | Status: AC
  Filled 2011-08-01: qty 3

## 2011-08-01 MED ORDER — AZITHROMYCIN 1 G PO PACK
1.0000 g | PACK | Freq: Once | ORAL | Status: AC
  Administered 2011-08-01: 1 g via ORAL
  Filled 2011-08-01: qty 1

## 2011-08-01 MED ORDER — LEVONORGESTREL 0.75 MG PO TABS
ORAL_TABLET | ORAL | Status: AC
  Filled 2011-08-01: qty 2

## 2011-08-01 MED ORDER — LEVONORGESTREL 0.75 MG PO TABS
1.5000 mg | ORAL_TABLET | Freq: Once | ORAL | Status: DC
  Filled 2011-08-01: qty 2
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
## 2011-08-01 NOTE — ED Notes (Signed)
Called Sane Nurse about 5:15

## 2011-08-27 ENCOUNTER — Encounter: Payer: Self-pay | Admitting: Advanced Practice Midwife

## 2011-09-09 ENCOUNTER — Emergency Department (HOSPITAL_COMMUNITY): Payer: Self-pay

## 2011-09-09 ENCOUNTER — Emergency Department (HOSPITAL_COMMUNITY)
Admission: EM | Admit: 2011-09-09 | Discharge: 2011-09-09 | Disposition: A | Discharge status: Home / Self Care | Payer: Self-pay | Attending: Emergency Medicine | Admitting: Emergency Medicine

## 2011-09-09 ENCOUNTER — Encounter (HOSPITAL_COMMUNITY): Payer: Self-pay | Admitting: *Deleted

## 2011-09-09 DIAGNOSIS — N200 Calculus of kidney: Secondary | ICD-10-CM | POA: Insufficient documentation

## 2011-09-09 DIAGNOSIS — N10 Acute tubulo-interstitial nephritis: Secondary | ICD-10-CM | POA: Insufficient documentation

## 2011-09-09 DIAGNOSIS — M549 Dorsalgia, unspecified: Secondary | ICD-10-CM | POA: Insufficient documentation

## 2011-09-09 DIAGNOSIS — R509 Fever, unspecified: Secondary | ICD-10-CM | POA: Insufficient documentation

## 2011-09-09 DIAGNOSIS — R109 Unspecified abdominal pain: Secondary | ICD-10-CM | POA: Insufficient documentation

## 2011-09-09 HISTORY — DX: Calculus of kidney: N20.0

## 2011-09-09 HISTORY — DX: Urinary tract infection, site not specified: N39.0

## 2011-09-09 LAB — CBC WITH DIFFERENTIAL/PLATELET
Eosinophils Absolute: 0.1 10*3/uL (ref 0.0–0.7)
Lymphocytes Relative: 24 % (ref 12–46)
Lymphs Abs: 1.7 10*3/uL (ref 0.7–4.0)
MCH: 32.1 pg (ref 26.0–34.0)
Neutrophils Relative %: 68 % (ref 43–77)
Platelets: 255 10*3/uL (ref 150–400)
RBC: 3.77 MIL/uL — ABNORMAL LOW (ref 3.87–5.11)
WBC: 7.3 10*3/uL (ref 4.0–10.5)

## 2011-09-09 LAB — URINALYSIS, ROUTINE W REFLEX MICROSCOPIC
Bilirubin Urine: NEGATIVE
Ketones, ur: NEGATIVE mg/dL
Nitrite: NEGATIVE
Specific Gravity, Urine: >1.03 — ABNORMAL HIGH (ref 1.005–1.030)
Urobilinogen, UA: 0.2 mg/dL (ref 0.0–1.0)
pH: 6 (ref 5.0–8.0)

## 2011-09-09 LAB — URINE MICROSCOPIC-ADD ON

## 2011-09-09 LAB — BASIC METABOLIC PANEL
GFR calc non Af Amer: >90 mL/min (ref >90)
Glucose, Bld: 99 mg/dL (ref 70–99)
Potassium: 3.1 mEq/L — ABNORMAL LOW (ref 3.5–5.1)
Sodium: 143 mEq/L (ref 135–145)

## 2011-09-09 MED ORDER — ONDANSETRON HCL 4 MG/2ML IJ SOLN
4.0000 mg | Freq: Once | INTRAMUSCULAR | Status: AC
  Administered 2011-09-09: 4 mg via INTRAVENOUS

## 2011-09-09 MED ORDER — ONDANSETRON HCL 4 MG/2ML IJ SOLN
INTRAMUSCULAR | Status: AC
  Administered 2011-09-09: 4 mg via INTRAVENOUS
  Filled 2011-09-09: qty 2

## 2011-09-09 MED ORDER — SODIUM CHLORIDE 0.9 % IV SOLN: INTRAVENOUS | Status: DC

## 2011-09-09 MED ORDER — HYDROMORPHONE HCL PF 1 MG/ML IJ SOLN
INTRAMUSCULAR | Status: AC
  Administered 2011-09-09: 1 mg via INTRAVENOUS
  Filled 2011-09-09: qty 1

## 2011-09-09 MED ORDER — DEXTROSE 5 % IV SOLN
1.0000 g | Freq: Once | INTRAVENOUS | Status: AC
  Administered 2011-09-09: 1 g via INTRAVENOUS

## 2011-09-09 MED ORDER — SODIUM CHLORIDE 0.9 % IV BOLUS (SEPSIS)
1000.0000 mL | Freq: Once | INTRAVENOUS | Status: AC
  Administered 2011-09-09: 1000 mL via INTRAVENOUS

## 2011-09-09 MED ORDER — HYDROMORPHONE HCL PF 1 MG/ML IJ SOLN
1.0000 mg | Freq: Once | INTRAMUSCULAR | Status: AC
  Administered 2011-09-09: 1 mg via INTRAVENOUS

## 2011-09-09 MED ORDER — CEPHALEXIN 500 MG PO CAPS: 500.0000 mg | ORAL_CAPSULE | Freq: Four times a day (QID) | ORAL | Status: AC

## 2011-09-09 MED ORDER — HYDROMORPHONE HCL PF 1 MG/ML IJ SOLN
1.0000 mg | Freq: Once | INTRAMUSCULAR | Status: AC
  Administered 2011-09-09: 1 mg via INTRAVENOUS
  Filled 2011-09-09: qty 1

## 2011-09-09 MED ORDER — HYDROCODONE-ACETAMINOPHEN 5-325 MG PO TABS: 1.0000 | ORAL_TABLET | Freq: Four times a day (QID) | ORAL | Status: AC | PRN

## 2011-09-09 NOTE — ED Notes (Signed)
Pt asking for something for pain , Dr. Deretha Emory notified,

## 2011-09-09 NOTE — ED Notes (Signed)
Pt instructed not to drive due to pain medication

## 2011-09-09 NOTE — ED Notes (Signed)
Pt states lower back pain, blood in urine and painful urination. NAD. Pt also states nausea.

## 2011-09-09 NOTE — ED Provider Notes (Signed)
History    This chart was scribed for Danielle Jakes, MD, MD by Smitty Pluck. The patient was seen in room APA10 and the patient's care was started at 4:13PM.   CSN: [id number]  Arrival date & time 09/09/11  1317   First MD Initiated Contact with Patient 09/09/11 1515      Chief Complaint  Patient presents with  . Back Pain  . Hematuria  . Nausea    (Consider location/radiation/quality/duration/timing/severity/associated sxs/prior treatment) Patient is a 36 y.o. female presenting with hematuria, dysuria, and back pain. The history is provided by the patient.  Hematuria This is a new problem. The current episode started today. The problem is unchanged. The pain is moderate. Associated symptoms include dysuria, fever and nausea. Pertinent negatives include no vomiting.  Dysuria  This is a new problem. The current episode started 12 to 24 hours ago. The problem has not changed since onset.The pain is moderate. The maximum temperature recorded prior to her arrival was 101 to 101.9 F. Associated symptoms include nausea and hematuria. Pertinent negatives include no vomiting.  Back Pain  This is a new problem. The current episode started yesterday. The problem occurs constantly. The problem has not changed since onset.The pain does not radiate. The pain is at a severity of 8/10. Associated symptoms include a fever and dysuria. Pertinent negatives include no chest pain and no headaches.   Danielle Frederick is a 37 y.o. female who presents to the Emergency Department complaining of constant, moderate back pain onset 1 day ago and dysuria and hematuria onset today. Pt reports 8-9/10 before pain medication. She states that she has nausea without vomiting.She reports having moderate pubic abdominal pain.She reports having fever of 101. Pt has hx of kidney stone with the last one multiple years ago. Pt reports hx of UTI. Denies diarrhea, SOB, chest pain, vaginal discharge, vaginal bleeding, headache and  rash.   Past Medical History  Diagnosis Date  . Endometriosis   . Kidney stones   . Urinary tract infection     Past Surgical History  Procedure Date  . Laparoscopic endometriosis fulguration   . Cesarean section   . Tubal ligation     No family history on file.  History  Substance Use Topics  . Smoking status: Current Everyday Smoker -- 1.0 packs/day  . Smokeless tobacco: Not on file  . Alcohol Use: No    OB History    Grav Para Term Preterm Abortions TAB SAB Ect Mult Living                  Review of Systems  Constitutional: Positive for fever.  Respiratory: Negative for cough and shortness of breath.   Cardiovascular: Negative for chest pain.  Gastrointestinal: Positive for nausea. Negative for vomiting.  Genitourinary: Positive for dysuria and hematuria. Negative for vaginal bleeding and vaginal discharge.  Musculoskeletal: Positive for back pain.  Skin: Negative for rash.  Neurological: Negative for headaches.    Allergies  Review of patient's allergies indicates no known allergies.  Home Medications   Current Outpatient Rx  Name Route Sig Dispense Refill  . CEPHALEXIN 500 MG PO CAPS Oral Take 1 capsule (500 mg total) by mouth 4 (four) times daily. 28 capsule 0  . HYDROCODONE-ACETAMINOPHEN 5-325 MG PO TABS Oral Take 1-2 tablets by mouth every 6 (six) hours as needed for pain. 10 tablet 0    BP 106/63  Pulse 81  Temp 98.5 F (36.9 C) (Oral)  Resp 20  Ht  4\' 11"  (1.499 m)  Wt 100 lb (45.36 kg)  BMI 20.20 kg/m2  SpO2 97%  LMP 08/19/2011  Physical Exam  Nursing note and vitals reviewed. Constitutional: She is oriented to person, place, and time. She appears well-developed and well-nourished.  HENT:  Head: Normocephalic and atraumatic.  Eyes: Conjunctivae are normal.  Cardiovascular: Normal rate, regular rhythm and normal heart sounds.   No murmur heard. Pulmonary/Chest: Effort normal and breath sounds normal. No respiratory distress. She has no  wheezes. She has no rales.  Abdominal: Bowel sounds are normal. She exhibits no distension. There is no tenderness.       Flank non-tender   Musculoskeletal: She exhibits no edema.       Back is non-tender   Neurological: She is alert and oriented to person, place, and time.  Skin: Skin is warm and dry.  Psychiatric: She has a normal mood and affect. Her behavior is normal.    ED Course  Procedures (including critical care time) DIAGNOSTIC STUDIES: Oxygen Saturation is 97% on room air, normal by my interpretation.    COORDINATION OF CARE: 4:17PM EDP discusses pt ED treatment with pt  4:15PM EDP ordered medication: Scheduled Meds:    . HYDROmorphone  1 mg Intravenous Once  . HYDROmorphone  1 mg Intravenous Once  . ondansetron  4 mg Intravenous Once  . sodium chloride  1,000 mL Intravenous Once   Continuous Infusions:    . sodium chloride    . cefTRIAXone (ROCEPHIN) IVPB 1 gram/50 mL D5W 1 g (09/09/11 1716)   PRN Meds:.    Labs Reviewed  URINALYSIS, ROUTINE W REFLEX MICROSCOPIC - Abnormal; Notable for the following:    APPearance HAZY (*)     Specific Gravity, Urine >1.030 (*)     Hgb urine dipstick SMALL (*)     Leukocytes, UA SMALL (*)     All other components within normal limits  URINE MICROSCOPIC-ADD ON - Abnormal; Notable for the following:    Squamous Epithelial / LPF FEW (*)     Bacteria, UA FEW (*)     All other components within normal limits  CBC WITH DIFFERENTIAL - Abnormal; Notable for the following:    RBC 3.77 (*)     HCT 34.5 (*)     All other components within normal limits  BASIC METABOLIC PANEL - Abnormal; Notable for the following:    Potassium 3.1 (*)     All other components within normal limits  PREGNANCY, URINE  URINE CULTURE   Ct Abdomen Pelvis Wo Contrast  09/09/2011  *RADIOLOGY REPORT*  Clinical Data: Flank pain.  CT ABDOMEN AND PELVIS WITHOUT CONTRAST  Technique:  Multidetector CT imaging of the abdomen and pelvis was performed  following the standard protocol without intravenous contrast.  Comparison: 07/20/2011.  Findings: The lung bases are clear except for minimal dependent atelectasis.  The liver is unremarkable and stable.  No focal lesions or intrahepatic biliary dilatation.  The gallbladder is normal.  No common bile duct dilatation.  The pancreas is grossly normal.  The spleen is normal.  The adrenal glands and kidneys are unremarkable except for a stable left mid pole renal calculus.  No hydronephrosis and no obstructing ureteral calculi.  No bladder calculi.  The stomach, duodenum, small bowel and colon are grossly normal without oral contrast.  No inflammatory changes or definite mass lesions.  The appendix is normal.  No mesenteric or retroperitoneal mass or adenopathy.  Small scattered lymph nodes are noted.  The  aorta is normal in caliber.  The uterus and ovaries are unremarkable.  The bladder is normal. No pelvic mass, adenopathy or free pelvic fluid collection.  No inguinal mass or hernia.  The bony structures are unremarkable.  IMPRESSION:  1.  Stable left mid pole renal calculus.  No obstructing ureteral calculi. 2.  No acute abdominal/pelvic findings, mass lesions or adenopathy.  Original Report Authenticated By: P. Loralie Champagne, M.D.   Results for orders placed during the hospital encounter of 09/09/11  URINALYSIS, ROUTINE W REFLEX MICROSCOPIC      Component Value Range   Color, Urine YELLOW  YELLOW   APPearance HAZY (*) CLEAR   Specific Gravity, Urine >1.030 (*) 1.005 - 1.030   pH 6.0  5.0 - 8.0   Glucose, UA NEGATIVE  NEGATIVE mg/dL   Hgb urine dipstick SMALL (*) NEGATIVE   Bilirubin Urine NEGATIVE  NEGATIVE   Ketones, ur NEGATIVE  NEGATIVE mg/dL   Protein, ur NEGATIVE  NEGATIVE mg/dL   Urobilinogen, UA 0.2  0.0 - 1.0 mg/dL   Nitrite NEGATIVE  NEGATIVE   Leukocytes, UA SMALL (*) NEGATIVE  PREGNANCY, URINE      Component Value Range   Preg Test, Ur NEGATIVE  NEGATIVE  URINE MICROSCOPIC-ADD ON       Component Value Range   Squamous Epithelial / LPF FEW (*) RARE   WBC, UA TOO NUMEROUS TO COUNT  <3 WBC/hpf   RBC / HPF 3-6  <3 RBC/hpf   Bacteria, UA FEW (*) RARE  CBC WITH DIFFERENTIAL      Component Value Range   WBC 7.3  4.0 - 10.5 K/uL   RBC 3.77 (*) 3.87 - 5.11 MIL/uL   Hemoglobin 12.1  12.0 - 15.0 g/dL   HCT 16.1 (*) 09.6 - 04.5 %   MCV 91.5  78.0 - 100.0 fL   MCH 32.1  26.0 - 34.0 pg   MCHC 35.1  30.0 - 36.0 g/dL   RDW 40.9  81.1 - 91.4 %   Platelets 255  150 - 400 K/uL   Neutrophils Relative 68  43 - 77 %   Neutro Abs 5.0  1.7 - 7.7 K/uL   Lymphocytes Relative 24  12 - 46 %   Lymphs Abs 1.7  0.7 - 4.0 K/uL   Monocytes Relative 7  3 - 12 %   Monocytes Absolute 0.5  0.1 - 1.0 K/uL   Eosinophils Relative 1  0 - 5 %   Eosinophils Absolute 0.1  0.0 - 0.7 K/uL   Basophils Relative 0  0 - 1 %   Basophils Absolute 0.0  0.0 - 0.1 K/uL  BASIC METABOLIC PANEL      Component Value Range   Sodium 143  135 - 145 mEq/L   Potassium 3.1 (*) 3.5 - 5.1 mEq/L   Chloride 107  96 - 112 mEq/L   CO2 27  19 - 32 mEq/L   Glucose, Bld 99  70 - 99 mg/dL   BUN 8  6 - 23 mg/dL   Creatinine, Ser 7.82  0.50 - 1.10 mg/dL   Calcium 9.2  8.4 - 95.6 mg/dL   GFR calc non Af Amer >90  >90 mL/min   GFR calc Af Amer >90  >90 mL/min     1. Pyelonephritis, acute       MDM  Findings consistent with pyelonephritis. No evidence of ureteral kidney stone. Patient treated with IV Rocephin 1 g here in continue on Keflex urine culture has  been sent. Patient's pain is being controlled.   I personally performed the services described in this documentation, which was scribed in my presence. The recorded information has been reviewed and considered.        Danielle Jakes, MD 09/09/11 Windy Fast

## 2011-09-09 NOTE — ED Notes (Signed)
Pt c/o bilateral lower back pain that started last night, pain is associated with nausea, burning with urination, and pt states that she feels like she needs to urinate and then is not able to ,

## 2011-09-11 LAB — URINE CULTURE

## 2011-09-12 NOTE — ED Notes (Signed)
+   urine culture. Treated with Keflex, sensitive to same per protocol MD. 

## 2011-09-15 ENCOUNTER — Ambulatory Visit (HOSPITAL_COMMUNITY)
Admission: RE | Admit: 2011-09-15 | Discharge: 2011-09-15 | Disposition: A | Discharge status: Home / Self Care | Payer: Self-pay | Attending: Psychiatry | Admitting: Psychiatry

## 2011-09-15 ENCOUNTER — Encounter (HOSPITAL_COMMUNITY): Payer: Self-pay | Admitting: *Deleted

## 2011-09-15 DIAGNOSIS — F1420 Cocaine dependence, uncomplicated: Secondary | ICD-10-CM | POA: Insufficient documentation

## 2011-09-15 DIAGNOSIS — F4320 Adjustment disorder, unspecified: Secondary | ICD-10-CM | POA: Insufficient documentation

## 2011-09-15 HISTORY — DX: Depression, unspecified: F32.A

## 2011-09-15 HISTORY — DX: Major depressive disorder, single episode, unspecified: F32.9
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
## 2011-09-22 ENCOUNTER — Emergency Department (HOSPITAL_COMMUNITY)
Admission: EM | Admit: 2011-09-22 | Discharge: 2011-09-24 | Disposition: A | Discharge status: Home / Self Care | Payer: Self-pay | Attending: Emergency Medicine | Admitting: Emergency Medicine

## 2011-09-22 ENCOUNTER — Encounter (HOSPITAL_COMMUNITY): Payer: Self-pay

## 2011-09-22 DIAGNOSIS — F329 Major depressive disorder, single episode, unspecified: Secondary | ICD-10-CM | POA: Insufficient documentation

## 2011-09-22 DIAGNOSIS — F17200 Nicotine dependence, unspecified, uncomplicated: Secondary | ICD-10-CM | POA: Insufficient documentation

## 2011-09-22 DIAGNOSIS — Z5900 Homelessness unspecified: Secondary | ICD-10-CM | POA: Insufficient documentation

## 2011-09-22 DIAGNOSIS — F1910 Other psychoactive substance abuse, uncomplicated: Secondary | ICD-10-CM | POA: Insufficient documentation

## 2011-09-22 DIAGNOSIS — F3289 Other specified depressive episodes: Secondary | ICD-10-CM | POA: Insufficient documentation

## 2011-09-22 DIAGNOSIS — R45851 Suicidal ideations: Secondary | ICD-10-CM | POA: Insufficient documentation

## 2011-09-22 DIAGNOSIS — F32A Depression, unspecified: Secondary | ICD-10-CM

## 2011-09-22 HISTORY — DX: Post-traumatic stress disorder, unspecified: F43.10

## 2011-09-22 LAB — URINALYSIS, ROUTINE W REFLEX MICROSCOPIC
Ketones, ur: 15 mg/dL — AB
Leukocytes, UA: NEGATIVE
Nitrite: NEGATIVE
Specific Gravity, Urine: 1.02 (ref 1.005–1.030)
Urobilinogen, UA: 0.2 mg/dL (ref 0.0–1.0)
pH: 6 (ref 5.0–8.0)

## 2011-09-22 LAB — CBC WITH DIFFERENTIAL/PLATELET
Basophils Relative: 0 % (ref 0–1)
Eosinophils Absolute: 0 10*3/uL (ref 0.0–0.7)
Eosinophils Relative: 1 % (ref 0–5)
HCT: 37.3 % (ref 36.0–46.0)
Hemoglobin: 13 g/dL (ref 12.0–15.0)
MCH: 32.1 pg (ref 26.0–34.0)
MCHC: 34.9 g/dL (ref 30.0–36.0)
MCV: 92.1 fL (ref 78.0–100.0)
Monocytes Absolute: 0.6 10*3/uL (ref 0.1–1.0)
Monocytes Relative: 9 % (ref 3–12)
Neutrophils Relative %: 61 % (ref 43–77)

## 2011-09-22 LAB — BASIC METABOLIC PANEL
BUN: 10 mg/dL (ref 6–23)
Creatinine, Ser: 0.66 mg/dL (ref 0.50–1.10)
GFR calc Af Amer: >90 mL/min (ref >90)
GFR calc non Af Amer: >90 mL/min (ref >90)
Glucose, Bld: 87 mg/dL (ref 70–99)

## 2011-09-22 LAB — RAPID URINE DRUG SCREEN, HOSP PERFORMED
Barbiturates: NOT DETECTED
Tetrahydrocannabinol: POSITIVE — AB

## 2011-09-22 MED ORDER — ONDANSETRON 4 MG PO TBDP
4.0000 mg | ORAL_TABLET | Freq: Four times a day (QID) | ORAL | Status: DC | PRN
Start: 2011-09-22 — End: 2011-09-24

## 2011-09-22 MED ORDER — DICYCLOMINE HCL 10 MG PO CAPS: 20.0000 mg | ORAL_CAPSULE | Freq: Four times a day (QID) | ORAL | Status: DC | PRN

## 2011-09-22 MED ORDER — METHOCARBAMOL 500 MG PO TABS: 500.0000 mg | ORAL_TABLET | Freq: Three times a day (TID) | ORAL | Status: DC | PRN

## 2011-09-22 MED ORDER — LOPERAMIDE HCL 2 MG PO CAPS: 2.0000 mg | ORAL_CAPSULE | ORAL | Status: DC | PRN

## 2011-09-22 MED ORDER — ACETAMINOPHEN 325 MG PO TABS: 650.0000 mg | ORAL_TABLET | ORAL | Status: DC | PRN

## 2011-09-22 MED ORDER — LORAZEPAM 1 MG PO TABS
1.0000 mg | ORAL_TABLET | Freq: Three times a day (TID) | ORAL | Status: DC | PRN
  Administered 2011-09-23 – 2011-09-24 (×3): 1 mg via ORAL
  Filled 2011-09-22 (×2): qty 1

## 2011-09-22 MED ORDER — CLONIDINE HCL 0.1 MG PO TABS: 0.1000 mg | ORAL_TABLET | Freq: Four times a day (QID) | ORAL | Status: DC

## 2011-09-22 MED ORDER — NICOTINE 21 MG/24HR TD PT24
21.0000 mg | MEDICATED_PATCH | Freq: Every day | TRANSDERMAL | Status: DC
  Administered 2011-09-22 – 2011-09-24 (×3): 21 mg via TRANSDERMAL
  Filled 2011-09-22 (×2): qty 1

## 2011-09-22 MED ORDER — BENZONATATE 100 MG PO CAPS
100.0000 mg | ORAL_CAPSULE | Freq: Once | ORAL | Status: AC
  Administered 2011-09-22: 100 mg via ORAL
  Filled 2011-09-22: qty 1

## 2011-09-22 MED ORDER — TRAZODONE HCL 50 MG PO TABS
50.0000 mg | ORAL_TABLET | Freq: Every evening | ORAL | Status: DC | PRN
  Administered 2011-09-23: 50 mg via ORAL
  Filled 2011-09-22 (×2): qty 1

## 2011-09-22 MED ORDER — ZOLPIDEM TARTRATE 5 MG PO TABS
5.0000 mg | ORAL_TABLET | Freq: Every evening | ORAL | Status: DC | PRN
  Administered 2011-09-22: 5 mg via ORAL
  Filled 2011-09-22: qty 1

## 2011-09-22 MED ORDER — ONDANSETRON HCL 4 MG PO TABS: 4.0000 mg | ORAL_TABLET | Freq: Three times a day (TID) | ORAL | Status: DC | PRN

## 2011-09-22 MED ORDER — HYDROXYZINE HCL 25 MG PO TABS
25.0000 mg | ORAL_TABLET | Freq: Four times a day (QID) | ORAL | Status: DC | PRN
  Administered 2011-09-23: 25 mg via ORAL
  Filled 2011-09-22: qty 1

## 2011-09-22 MED ORDER — FLUOXETINE HCL 20 MG PO CAPS
20.0000 mg | ORAL_CAPSULE | Freq: Every day | ORAL | Status: DC
  Administered 2011-09-23 – 2011-09-24 (×2): 20 mg via ORAL
  Filled 2011-09-22 (×4): qty 1

## 2011-09-22 MED ORDER — FLUOXETINE HCL 10 MG PO CAPS
10.0000 mg | ORAL_CAPSULE | Freq: Every day | ORAL | Status: DC
  Administered 2011-09-22: 10 mg via ORAL
  Filled 2011-09-22 (×2): qty 1

## 2011-09-22 MED ORDER — CLONIDINE HCL 0.1 MG PO TABS: 0.1000 mg | ORAL_TABLET | Freq: Every day | ORAL | Status: DC

## 2011-09-22 MED ORDER — CLONIDINE HCL 0.1 MG PO TABS: 0.1000 mg | ORAL_TABLET | ORAL | Status: DC

## 2011-09-22 MED ORDER — POTASSIUM CHLORIDE CRYS ER 20 MEQ PO TBCR
60.0000 meq | EXTENDED_RELEASE_TABLET | Freq: Once | ORAL | Status: AC
  Administered 2011-09-22: 60 meq via ORAL
  Filled 2011-09-22: qty 3

## 2011-09-22 MED ORDER — NAPROXEN 250 MG PO TABS: 500.0000 mg | ORAL_TABLET | Freq: Two times a day (BID) | ORAL | Status: DC | PRN

## 2011-09-22 MED ORDER — LORAZEPAM 1 MG PO TABS
1.0000 mg | ORAL_TABLET | Freq: Once | ORAL | Status: AC
  Administered 2011-09-22: 1 mg via ORAL
  Filled 2011-09-22: qty 1

## 2011-09-22 NOTE — ED Notes (Signed)
Requesting meds for sleep

## 2011-09-22 NOTE — ED Notes (Signed)
Family at bedside. 

## 2011-09-22 NOTE — ED Notes (Signed)
Pt given coke andgraham crackers. Sitter remains at bedside.
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
## 2011-09-22 NOTE — ED Notes (Signed)
Advised by Diplomatic Services operational officer that Natalia Leatherwood from [facility] needs pt to have a telepysch to be done, paperwork filled out & given to EDP.

## 2011-09-22 NOTE — ED Notes (Signed)
T. Murphy from act to see pt. 

## 2011-09-22 NOTE — ED Notes (Signed)
Tele psych called at this time will be within the hour. Danielle Frederick

## 2011-09-22 NOTE — ED Notes (Signed)
Pt states she has been smoking crack tonight, states she is homeless and hasn't eaten for a couple of days, has no support system and family will not help her.   Pt states she "just wants to die"
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
## 2011-09-22 NOTE — ED Notes (Addendum)
Spoke to [employer] doctor who states he feels the pt will need admission to psychiatric hospital for further treatment, and will send his findings.

## 2011-09-22 NOTE — ED Notes (Signed)
Patient placed in paper scrubs. Clothing and belongings secured in bags in EMS closet. Patient wanded by security.

## 2011-09-22 NOTE — ED Notes (Signed)
Pt requesting something for cough, EDP notified.

## 2011-09-22 NOTE — ED Notes (Signed)
Pt sleeping.  Sent order to pharmacy for Prozac which in not in the ED pyxis. Pt is sleeping at this time, Nicotine patch holding until pt wakes up.

## 2011-09-22 NOTE — ED Notes (Signed)
Pt remains in hallway with sitter at bedside. Pt ate all of breakfast this morning. To be assessed by [facility]. NAD.

## 2011-09-22 NOTE — ED Notes (Signed)
Dozing at times in bed, sitter at bedside.

## 2011-09-22 NOTE — ED Notes (Signed)
Pt sitting in the floor in the doorway, sorting through decks of cards. NAD noted at this time. Pt checking to make sure she has something ordered to help her sleep.

## 2011-09-22 NOTE — ED Notes (Signed)
telepsych being done at this time.  

## 2011-09-23 MED ORDER — TRAZODONE HCL 50 MG PO TABS
ORAL_TABLET | ORAL | Status: AC
  Filled 2011-09-23: qty 1

## 2011-09-23 NOTE — ED Notes (Signed)
Pt pleasant, calm, cooperative; coloring with crayons and pages from coloring book. In no distress. Given Sprite to drink.

## 2011-09-23 NOTE — ED Notes (Signed)
Pt resting calmly w/ eyes closed. Rise & fall of the chest noted. Sitter at bedside. Bed in low position, side rails up x2. NAD noted at this time.  

## 2011-09-23 NOTE — ED Notes (Signed)
I assumed care of patient at this time. Report received from Missy Sabins, RN.

## 2011-09-23 NOTE — ED Notes (Signed)
Patient requesting Ativan. 1 MG Ativan given per PRN orders. See MAR for further documentation.

## 2011-09-23 NOTE — Progress Notes (Signed)
[id number] Patient awaiting placement. Seen by tele psych who recommended hospitalization. Medication changes made. Clonidine was ordered to assist in withdrawal however BP has been low and medication held x 3. Cancelled clonidine. No behavioral issues overnight.

## 2011-09-23 NOTE — ED Notes (Addendum)
Pt accepted to [facility], but no beds available at this time. Pt is on their waitlist.
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
## 2011-09-23 NOTE — ED Notes (Signed)
Patient provided breakfast tray. Awake, cooperative. Offered shower and will set up after breakfast. Denies any other needs at present.

## 2011-09-24 MED ORDER — LORAZEPAM 1 MG PO TABS
ORAL_TABLET | ORAL | Status: AC
  Filled 2011-09-24: qty 1

## 2011-09-24 MED ORDER — NICOTINE 21 MG/24HR TD PT24
MEDICATED_PATCH | TRANSDERMAL | Status: AC
  Filled 2011-09-24: qty 1

## 2011-09-24 NOTE — ED Notes (Signed)
Report given to Jae Dire, Charity fundraiser at [facility].

## 2011-09-24 NOTE — ED Notes (Signed)
Pt given breakfast tray

## 2011-09-24 NOTE — Progress Notes (Signed)
8:39 AM Seen on rounds.  Complains of anxiety, does not feel suicidal at present.  Awaiting psychiatric placement.

## 2011-09-24 NOTE — ED Notes (Signed)
Pt states that she is feeling better while she is in the hospital but feels that if she goes home she would kill herself, pt states " I don't know what I would do if I went home, I would want to hut myself"

## 2011-09-24 NOTE — ED Notes (Signed)
[facility] called for transport to [facility]. 

## 2011-09-24 NOTE — ED Notes (Signed)
Pt has been accepted at [facility].  

## 2011-09-24 NOTE — Progress Notes (Signed)
10:45 AM Pt has been accepted for transfer to [facility] by Dr. Betti Cruz.

## 2011-09-24 NOTE — ED Notes (Addendum)
Received report on pt, pt resting in room with eyes closed, sitter at bedside,
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
## 2011-09-24 NOTE — BHH Counselor (Signed)
[facility] authorization for 4 days, [phone] given by Stephan Minister; 8/22-8/25.

## 2011-09-24 NOTE — ED Notes (Signed)
Sheriff here to transport patient. 

## 2011-09-24 NOTE — ED Notes (Signed)
Dr. Ignacia Palma at bedside, pt updated on plan of care, sitter at bedside,

## 2014-09-22 ENCOUNTER — Emergency Department (HOSPITAL_COMMUNITY)
Admission: EM | Admit: 2014-09-22 | Discharge: 2014-09-23 | Disposition: A | Discharge status: Home / Self Care | Payer: Self-pay | Attending: Emergency Medicine | Admitting: Emergency Medicine

## 2014-09-22 ENCOUNTER — Encounter (HOSPITAL_COMMUNITY): Payer: Self-pay | Admitting: *Deleted

## 2014-09-22 DIAGNOSIS — R1030 Lower abdominal pain, unspecified: Secondary | ICD-10-CM | POA: Insufficient documentation

## 2014-09-22 DIAGNOSIS — M549 Dorsalgia, unspecified: Secondary | ICD-10-CM | POA: Insufficient documentation

## 2014-09-22 DIAGNOSIS — R630 Anorexia: Secondary | ICD-10-CM | POA: Insufficient documentation

## 2014-09-22 DIAGNOSIS — Z8659 Personal history of other mental and behavioral disorders: Secondary | ICD-10-CM | POA: Insufficient documentation

## 2014-09-22 DIAGNOSIS — Z3202 Encounter for pregnancy test, result negative: Secondary | ICD-10-CM | POA: Insufficient documentation

## 2014-09-22 DIAGNOSIS — Z87440 Personal history of urinary (tract) infections: Secondary | ICD-10-CM | POA: Insufficient documentation

## 2014-09-22 DIAGNOSIS — R110 Nausea: Secondary | ICD-10-CM | POA: Insufficient documentation

## 2014-09-22 DIAGNOSIS — Z720 Tobacco use: Secondary | ICD-10-CM | POA: Insufficient documentation

## 2014-09-22 DIAGNOSIS — Z87442 Personal history of urinary calculi: Secondary | ICD-10-CM | POA: Insufficient documentation

## 2014-09-22 LAB — CBC WITH DIFFERENTIAL/PLATELET
BASOS ABS: 0 10*3/uL (ref 0.0–0.1)
Basophils Relative: 0 % (ref 0–1)
Eosinophils Absolute: 0.1 10*3/uL (ref 0.0–0.7)
Eosinophils Relative: 2 % (ref 0–5)
HEMATOCRIT: 37.5 % (ref 36.0–46.0)
Hemoglobin: 13.2 g/dL (ref 12.0–15.0)
LYMPHS ABS: 3.3 10*3/uL (ref 0.7–4.0)
LYMPHS PCT: 42 % (ref 12–46)
MCH: 31.8 pg (ref 26.0–34.0)
MCHC: 35.2 g/dL (ref 30.0–36.0)
MCV: 90.4 fL (ref 78.0–100.0)
MONO ABS: 0.7 10*3/uL (ref 0.1–1.0)
Monocytes Relative: 9 % (ref 3–12)
NEUTROS ABS: 3.7 10*3/uL (ref 1.7–7.7)
Neutrophils Relative %: 47 % (ref 43–77)
Platelets: 274 10*3/uL (ref 150–400)
RBC: 4.15 MIL/uL (ref 3.87–5.11)
RDW: 15 % (ref 11.5–15.5)
WBC: 7.9 10*3/uL (ref 4.0–10.5)

## 2014-09-22 MED ORDER — HYDROCODONE-ACETAMINOPHEN 5-325 MG PO TABS
1.0000 | ORAL_TABLET | Freq: Once | ORAL | Status: AC
  Administered 2014-09-22: 1 via ORAL
  Filled 2014-09-22: qty 1

## 2014-09-22 NOTE — ED Provider Notes (Addendum)
CSN: [id number]     Arrival date & time 09/22/14  2204 History  This chart was scribed for Azalia Bilis, MD by Phillis Haggis, ED Scribe. This patient was seen in room [facility] and patient care was started at 11:34 PM.   Chief Complaint  Patient presents with  . Vaginal Bleeding   The history is provided by the patient. No language interpreter was used.  HPI Comments: Danielle Frederick is a 39 y.o. Female with hx of UTI, endometriosis, tubal ligation, and hysteroscopy with endometrial ablation who presents to the Emergency Department complaining of gradually worsening, constant vaginal bleeding onset 3 days ago. She reports associated intermittent nausea, decreased appetite, lower abdominal pain that radiates to her lower back and bilateral flanks: "I feel like my guts are coming out of my butt." She states that the pain has been intermittent since her ablation surgery in February but worsened over the past 3 days. Pt states that fiance told her she has also had a vaginal odor, to which she performed a douche and black blood began to come out of her vagina. Reports taking hydrocodone for pain when needed. She denies vaginal discharge and fever. Pt states that she has just moved to the area a week ago and has not established a PCP to continue to manage her continued care for this problem. She denies currently being on [facility].   Past Medical History  Diagnosis Date  . Endometriosis   . Kidney stones   . Urinary tract infection   . Depression   . Post traumatic stress disorder (PTSD)   . Depression    Past Surgical History  Procedure Laterality Date  . Laparoscopic endometriosis fulguration    . Cesarean section    . Tubal ligation    . Hysteroscopy w/ endometrial ablation    . Bilateral carpal tunnel release     History reviewed. No pertinent family history. Social History  Substance Use Topics  . Smoking status: Current Every Day Smoker -- 1.00 packs/day  . Smokeless tobacco: None  .  Alcohol Use: Yes     Comment: occasionally   OB History    No data available     Review of Systems  Constitutional: Positive for appetite change. Negative for fever.  Gastrointestinal: Positive for nausea and abdominal pain.  Genitourinary: Positive for flank pain and vaginal bleeding. Negative for vaginal discharge.       Vaginal odor  Musculoskeletal: Positive for back pain.  All other systems reviewed and are negative.  Allergies  Tramadol  Home Medications   Prior to Admission medications   Medication Sig Start Date End Date Taking? Authorizing Provider  acetaminophen (TYLENOL) 500 MG tablet Take 500 mg by mouth every 6 (six) hours as needed for mild pain or moderate pain.   Yes Historical Provider, MD   BP 130/65 mmHg  Pulse 83  Temp(Src) 98.4 F (36.9 C) (Oral)  Resp 20  Ht 5' (1.524 m)  Wt 95 lb (43.092 kg)  BMI 18.55 kg/m2  SpO2 100%  Physical Exam  Constitutional: She is oriented to person, place, and time. She appears well-developed and well-nourished.  HENT:  Head: Normocephalic and atraumatic.  Eyes: Conjunctivae and EOM are normal. Pupils are equal, round, and reactive to light.  Neck: Normal range of motion. Neck supple.  Cardiovascular: Normal rate and regular rhythm.   Pulmonary/Chest: Effort normal and breath sounds normal.  Abdominal: Soft. Bowel sounds are normal.  Musculoskeletal: Normal range of motion.  Neurological: She is  alert and oriented to person, place, and time.  Skin: Skin is warm and dry.  Psychiatric: She has a normal mood and affect. Her behavior is normal.  Nursing note and vitals reviewed.   ED Course  Procedures (including critical care time) DIAGNOSTIC STUDIES: Oxygen Saturation is 100% on RA, normal by my interpretation.    COORDINATION OF CARE: 11:39 PM-Discussed treatment plan which includes labs with pt at bedside and pt agreed to plan.    Labs Review Labs Reviewed  COMPREHENSIVE METABOLIC PANEL - Abnormal; Notable  for the following:    Calcium 8.8 (*)    Total Protein 6.3 (*)    AST 13 (*)    Total Bilirubin 0.1 (*)    All other components within normal limits  CBC WITH DIFFERENTIAL/PLATELET  PREGNANCY, URINE  URINALYSIS, ROUTINE W REFLEX MICROSCOPIC (NOT AT [facility])    I reviewed available ER/hospitalization records through the EMR Labs personally reviewed   Imaging Review No results found.   EKG Interpretation None      MDM   Final diagnoses:  Lower abdominal pain    Recurrent and chronic lower abdominal pain previously managed with 90 hydrocodone monthly. No vaginal discharge. Vitals and labs normal. Outpatient follow up for recurrent pelvic pain. Understands to return to ER for new or worsening symptoms   I personally performed the services described in this documentation, which was scribed in my presence. The recorded information has been reviewed and is accurate.     Azalia Bilis, MD 09/23/14 [id number]  Azalia Bilis, MD 09/23/14 [phone]

## 2014-09-22 NOTE — ED Notes (Signed)
Pt states she had a "hysteroscopy" and an ablation in February and pt was told to expect some pain and some bleeding for 12 weeks. Pt states she has been bleeding since her procedure. Pt c/o lower back pain and lower abdominal pain. Pt states her husband told her she had a bad odor and she douched and black blood came from her vagina.

## 2014-09-23 LAB — COMPREHENSIVE METABOLIC PANEL
ALT: 14 U/L (ref 14–54)
AST: 13 U/L — AB (ref 15–41)
Albumin: 3.8 g/dL (ref 3.5–5.0)
Alkaline Phosphatase: 53 U/L (ref 38–126)
Anion gap: 7 (ref 5–15)
BILIRUBIN TOTAL: 0.1 mg/dL — AB (ref 0.3–1.2)
BUN: 11 mg/dL (ref 6–20)
CALCIUM: 8.8 mg/dL — AB (ref 8.9–10.3)
CO2: 26 mmol/L (ref 22–32)
CREATININE: 0.48 mg/dL (ref 0.44–1.00)
Chloride: 104 mmol/L (ref 101–111)
GFR calc Af Amer: >60 mL/min (ref >60)
Glucose, Bld: 98 mg/dL (ref 65–99)
Potassium: 3.5 mmol/L (ref 3.5–5.1)
Sodium: 137 mmol/L (ref 135–145)
TOTAL PROTEIN: 6.3 g/dL — AB (ref 6.5–8.1)

## 2014-09-23 LAB — URINALYSIS, ROUTINE W REFLEX MICROSCOPIC
BILIRUBIN URINE: NEGATIVE
GLUCOSE, UA: NEGATIVE mg/dL
HGB URINE DIPSTICK: NEGATIVE
KETONES UR: NEGATIVE mg/dL
Leukocytes, UA: NEGATIVE
Nitrite: NEGATIVE
PH: 7 (ref 5.0–8.0)
Protein, ur: NEGATIVE mg/dL
Specific Gravity, Urine: 1.01 (ref 1.005–1.030)
Urobilinogen, UA: 0.2 mg/dL (ref 0.0–1.0)

## 2014-09-23 LAB — PREGNANCY, URINE: PREG TEST UR: NEGATIVE

## 2014-09-23 MED ORDER — IBUPROFEN 600 MG PO TABS: 600.0000 mg | ORAL_TABLET | Freq: Three times a day (TID) | ORAL | Status: DC | PRN

## 2014-09-23 NOTE — Discharge Instructions (Signed)
Abdominal Pain, Women °Abdominal (stomach, pelvic, or belly) pain can be caused by many things. It is important to tell your doctor: °· The location of the pain. °· Does it come and go or is it present all the time? °· Are there things that start the pain (eating certain foods, exercise)? °· Are there other symptoms associated with the pain (fever, nausea, vomiting, diarrhea)? °All of this is helpful to know when trying to find the cause of the pain. °CAUSES  °· Stomach: virus or bacteria infection, or ulcer. °· Intestine: appendicitis (inflamed appendix), regional ileitis (Crohn's disease), ulcerative colitis (inflamed colon), irritable bowel syndrome, diverticulitis (inflamed diverticulum of the colon), or cancer of the stomach or intestine. °· Gallbladder disease or stones in the gallbladder. °· Kidney disease, kidney stones, or infection. °· Pancreas infection or cancer. °· Fibromyalgia (pain disorder). °· Diseases of the female organs: °¨ Uterus: fibroid (non-cancerous) tumors or infection. °¨ Fallopian tubes: infection or tubal pregnancy. °¨ Ovary: cysts or tumors. °¨ Pelvic adhesions (scar tissue). °¨ Endometriosis (uterus lining tissue growing in the pelvis and on the pelvic organs). °¨ Pelvic congestion syndrome (female organs filling up with blood just before the menstrual period). °¨ Pain with the menstrual period. °¨ Pain with ovulation (producing an egg). °¨ Pain with an IUD (intrauterine device, birth control) in the uterus. °¨ Cancer of the female organs. °· Functional pain (pain not caused by a disease, may improve without treatment). °· Psychological pain. °· Depression. °DIAGNOSIS  °Your doctor will decide the seriousness of your pain by doing an examination. °· Blood tests. °· X-rays. °· Ultrasound. °· CT scan (computed tomography, special type of X-ray). °· MRI (magnetic resonance imaging). °· Cultures, for infection. °· Barium enema (dye inserted in the large intestine, to better view it with  X-rays). °· Colonoscopy (looking in intestine with a lighted tube). °· Laparoscopy (minor surgery, looking in abdomen with a lighted tube). °· Major abdominal exploratory surgery (looking in abdomen with a large incision). °TREATMENT  °The treatment will depend on the cause of the pain.  °· Many cases can be observed and treated at home. °· Over-the-counter medicines recommended by your caregiver. °· Prescription medicine. °· Antibiotics, for infection. °· Birth control pills, for painful periods or for ovulation pain. °· Hormone treatment, for endometriosis. °· Nerve blocking injections. °· Physical therapy. °· Antidepressants. °· Counseling with a psychologist or psychiatrist. °· Minor or major surgery. °HOME CARE INSTRUCTIONS  °· Do not take laxatives, unless directed by your caregiver. °· Take over-the-counter pain medicine only if ordered by your caregiver. Do not take aspirin because it can cause an upset stomach or bleeding. °· Try a clear liquid diet (broth or water) as ordered by your caregiver. Slowly move to a bland diet, as tolerated, if the pain is related to the stomach or intestine. °· Have a thermometer and take your temperature several times a day, and record it. °· Bed rest and sleep, if it helps the pain. °· Avoid sexual intercourse, if it causes pain. °· Avoid stressful situations. °· Keep your follow-up appointments and tests, as your caregiver orders. °· If the pain does not go away with medicine or surgery, you may try: °¨ Acupuncture. °¨ Relaxation exercises (yoga, meditation). °¨ Group therapy. °¨ Counseling. °SEEK MEDICAL CARE IF:  °· You notice certain foods cause stomach pain. °· Your home care treatment is not helping your pain. °· You need stronger pain medicine. °· You want your IUD removed. °· You feel faint or   lightheaded. °· You develop nausea and vomiting. °· You develop a rash. °· You are having side effects or an allergy to your medicine. °SEEK IMMEDIATE MEDICAL CARE IF:  °· Your  pain does not go away or gets worse. °· You have a fever. °· Your pain is felt only in portions of the abdomen. The right side could possibly be appendicitis. The left lower portion of the abdomen could be colitis or diverticulitis. °· You are passing blood in your stools (bright red or black tarry stools, with or without vomiting). °· You have blood in your urine. °· You develop chills, with or without a fever. °· You pass out. °MAKE SURE YOU:  °· Understand these instructions. °· Will watch your condition. °· Will get help right away if you are not doing well or get worse. °Document Released: 11/16/2006 Document Revised: 06/05/2013 Document Reviewed: 12/06/2008 °Exit[employer]® Patient Information ©2015 [employer]. This information is not intended to replace advice given to you by your health care provider. Make sure you discuss any questions you have with your health care provider. ° °

## 2014-09-23 NOTE — ED Notes (Signed)
Discharge instructions given, pt demonstrated teach back and verbal understanding. No concerns voiced.  

## 2015-05-02 ENCOUNTER — Emergency Department (HOSPITAL_COMMUNITY)
Admission: EM | Admit: 2015-05-02 | Discharge: 2015-05-02 | Disposition: A | Discharge status: Home / Self Care | Payer: Self-pay | Attending: Emergency Medicine | Admitting: Emergency Medicine

## 2015-05-02 ENCOUNTER — Encounter (HOSPITAL_COMMUNITY): Payer: Self-pay | Admitting: Emergency Medicine

## 2015-05-02 ENCOUNTER — Emergency Department (HOSPITAL_COMMUNITY): Payer: Self-pay

## 2015-05-02 DIAGNOSIS — S60222A Contusion of left hand, initial encounter: Secondary | ICD-10-CM | POA: Insufficient documentation

## 2015-05-02 DIAGNOSIS — Y93E5 Activity, floor mopping and cleaning: Secondary | ICD-10-CM | POA: Insufficient documentation

## 2015-05-02 DIAGNOSIS — F329 Major depressive disorder, single episode, unspecified: Secondary | ICD-10-CM | POA: Insufficient documentation

## 2015-05-02 DIAGNOSIS — S60512A Abrasion of left hand, initial encounter: Secondary | ICD-10-CM | POA: Insufficient documentation

## 2015-05-02 DIAGNOSIS — Z23 Encounter for immunization: Secondary | ICD-10-CM | POA: Insufficient documentation

## 2015-05-02 DIAGNOSIS — Y929 Unspecified place or not applicable: Secondary | ICD-10-CM | POA: Insufficient documentation

## 2015-05-02 DIAGNOSIS — Y990 Civilian activity done for income or pay: Secondary | ICD-10-CM | POA: Insufficient documentation

## 2015-05-02 DIAGNOSIS — W2209XA Striking against other stationary object, initial encounter: Secondary | ICD-10-CM | POA: Insufficient documentation

## 2015-05-02 DIAGNOSIS — F17200 Nicotine dependence, unspecified, uncomplicated: Secondary | ICD-10-CM | POA: Insufficient documentation

## 2015-05-02 MED ORDER — HYDROCODONE-ACETAMINOPHEN 5-325 MG PO TABS: 1.0000 | ORAL_TABLET | ORAL | Status: DC | PRN

## 2015-05-02 MED ORDER — NAPROXEN 375 MG PO TABS: 375.0000 mg | ORAL_TABLET | Freq: Two times a day (BID) | ORAL | Status: DC

## 2015-05-02 MED ORDER — TETANUS-DIPHTH-ACELL PERTUSSIS 5-2.5-18.5 LF-MCG/0.5 IM SUSP
0.5000 mL | Freq: Once | INTRAMUSCULAR | Status: AC
  Administered 2015-05-02: 0.5 mL via INTRAMUSCULAR
  Filled 2015-05-02: qty 0.5

## 2015-05-02 MED ORDER — BACITRACIN-NEOMYCIN-POLYMYXIN 400-5-5000 EX OINT
TOPICAL_OINTMENT | Freq: Once | CUTANEOUS | Status: AC
  Administered 2015-05-02: 1 via TOPICAL
  Filled 2015-05-02: qty 1

## 2015-05-02 NOTE — ED Notes (Signed)
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

## 2015-05-02 NOTE — ED Notes (Signed)
Pt verbalized understanding of no driving and to use caution within 4 hours of taking pain meds due to meds cause drowsiness 

## 2015-05-02 NOTE — Discharge Instructions (Signed)
Do not drive while taking the narcotic pain medication as it will make you sleepy. Follow up with Dr. Izora Ribasoley if you want further evaluation of the foreign body in your hand that has been there for years or if you continue to have a problem with your hand from this injury.

## 2015-05-02 NOTE — ED Notes (Signed)
Pt reports hit hand on a piece of furniture while at work. Pt reports pain and swelling ever since. Mild swelling noted to left hand. No deformity noted.

## 2015-05-02 NOTE — ED Provider Notes (Signed)
CSN: [id number]     Arrival date & time 05/02/15  1531 History   First MD Initiated Contact with Patient 05/02/15 1644     Chief Complaint  Patient presents with  . Hand Injury     (Consider location/radiation/quality/duration/timing/severity/associated sxs/prior Treatment) Patient is a 40 y.o. female presenting with hand injury. The history is provided by the patient. No language interpreter was used.  Hand Injury Location:  Hand Injury: yes   Hand location:  L hand Pain details:    Quality:  Aching and sharp   Radiates to:  Does not radiate   Severity:  Severe   Onset quality:  Sudden   Duration: just prior to arrival to the ED.   Timing:  Constant   Progression:  Worsening Chronicity:  New Handedness:  Right-handed Dislocation: no   Tetanus status:  Unknown Relieved by:  None tried Worsened by:  Movement Ineffective treatments:  None tried Associated symptoms: swelling   Associated symptoms comment:  Abrasion  Danielle Frederick is a 40 y.o. female who presents to the ED with pain, swelling and abrasion to the dorsum of the left hand. Patient states that she hit the back of her hand on a piece of wood furniture and and it immediately started swelling.   Past Medical History  Diagnosis Date  . Endometriosis   . Kidney stones   . Urinary tract infection   . Depression   . Post traumatic stress disorder (PTSD)   . Depression    Past Surgical History  Procedure Laterality Date  . Laparoscopic endometriosis fulguration    . Cesarean section    . Tubal ligation    . Hysteroscopy w/ endometrial ablation    . Bilateral carpal tunnel release     History reviewed. No pertinent family history. Social History  Substance Use Topics  . Smoking status: Current Every Day Smoker -- 1.00 packs/day  . Smokeless tobacco: None  . Alcohol Use: Yes     Comment: occasionally   OB History    No data available     Review of Systems  Musculoskeletal: Positive for arthralgias.      Pain, swelling and abrasion to the left hand   All other systems negative   Allergies  Tramadol  Home Medications   Prior to Admission medications   Medication Sig Start Date End Date Taking? Authorizing Provider  naproxen sodium (ANAPROX) 220 MG tablet Take 220 mg by mouth 2 (two) times daily with a meal.   Yes Historical Provider, MD  acetaminophen (TYLENOL) 500 MG tablet Take 500 mg by mouth every 6 (six) hours as needed for mild pain or moderate pain.    Historical Provider, MD  ibuprofen (ADVIL,MOTRIN) 600 MG tablet Take 1 tablet (600 mg total) by mouth every 8 (eight) hours as needed. Patient not taking: Reported on 05/02/2015 09/23/14   Azalia Bilis, MD   BP 115/65 mmHg  Pulse 79  Temp(Src) 98.1 F (36.7 C) (Oral)  Resp 16  Ht 5' (1.524 m)  Wt 47.628 kg  BMI 20.51 kg/m2  SpO2 100% Physical Exam  Constitutional: She is oriented to person, place, and time. She appears well-developed and well-nourished.  HENT:  Head: Normocephalic and atraumatic.  Eyes: Conjunctivae and EOM are normal.  Neck: Normal range of motion. Neck supple.  Cardiovascular: Normal rate.   Pulmonary/Chest: Effort normal.  Musculoskeletal: Normal range of motion.       Left hand: She exhibits tenderness, bony tenderness and swelling. She exhibits normal range  of motion, normal capillary refill and no deformity. Lacerations: abrasion. Normal sensation noted. Normal strength noted. She exhibits no thumb/finger opposition.       Hands: Small superficial abrasion to the dorsum of the left hand. There is swelling and tenderness. Radial pulse 2+, adequate circulation.   Neurological: She is alert and oriented to person, place, and time. No cranial nerve deficit.  Skin: Skin is warm and dry.  Psychiatric: She has a normal mood and affect. Her behavior is normal.  Nursing note and vitals reviewed.   ED Course  Procedures (including critical care time) X-ray, wound care, bacitracin ointment, dressing, ace  wrap, ice, elevation, pain management and tetanus updated.  Questioned patient regarding the FB that was identified on x-ray. She reports that several years ago she was working at a [employer] site and they were using a type of air gun to put out small pieces of metal. She hit her hand wile using it and her hand bleed but she did not think that it shot anything in her hand since it bleed. She does have occasional episodes of pain and swelling of the area where the FB was identified.  Imaging Review Dg Hand Complete Left  05/02/2015  CLINICAL DATA:  Left hand pain, injury moving furniture at work today, history of injury with nail gun 20 years ago. EXAM: LEFT HAND - COMPLETE 3+ VIEW COMPARISON:  None. FINDINGS: Three views of the left hand submitted. No acute fracture or subluxation. There is a triangular shaped metallic foreign body within soft tissue dorsal aspect adjacent to the base of proximal phalanx fourth finger. Best seen on lateral view measures about 4 mm in length. IMPRESSION: No acute fracture or subluxation. There is a triangular shaped metallic foreign body within soft tissue dorsal aspect adjacent to the base of proximal phalanx fourth finger. Best seen on lateral view measures about 4 mm in length. Electronically Signed   By: Natasha MeadLiviu  Pop M.D.   On: 05/02/2015 16:01   I have personally reviewed and evaluated the images and results as part of my medical decision-making.   MDM  40 y.o. female with pain, swelling and abrasion to the dorsum of the left hand s/p injury stable for d/c without focal neuro deficits. Will treat for pain and inflammation. Discussed with the patient f/u with the hand surgeon for the foreign body to the hand that was an incidental finding on x-ray or if she continues to have pain from today's injury.   Final diagnoses:  Contusion of left hand, initial encounter  Abrasion of left hand, initial encounter       [facility] Jakyiah Briones, NP 05/02/15 1710  Marily MemosJason Mesner,  MD 05/06/15 1527

## 2015-07-31 ENCOUNTER — Encounter (HOSPITAL_COMMUNITY): Payer: Self-pay | Admitting: Emergency Medicine

## 2015-07-31 ENCOUNTER — Emergency Department (HOSPITAL_COMMUNITY)
Admission: EM | Admit: 2015-07-31 | Discharge: 2015-07-31 | Disposition: A | Discharge status: Home / Self Care | Payer: Self-pay | Attending: Emergency Medicine | Admitting: Emergency Medicine

## 2015-07-31 DIAGNOSIS — F329 Major depressive disorder, single episode, unspecified: Secondary | ICD-10-CM | POA: Insufficient documentation

## 2015-07-31 DIAGNOSIS — F17210 Nicotine dependence, cigarettes, uncomplicated: Secondary | ICD-10-CM | POA: Insufficient documentation

## 2015-07-31 DIAGNOSIS — Z5321 Procedure and treatment not carried out due to patient leaving prior to being seen by health care provider: Secondary | ICD-10-CM | POA: Insufficient documentation

## 2015-07-31 DIAGNOSIS — M533 Sacrococcygeal disorders, not elsewhere classified: Secondary | ICD-10-CM | POA: Insufficient documentation

## 2015-07-31 NOTE — ED Notes (Signed)
Pt reports riding go carts yesterday. States she woke up this morning with severe tailbone pain. Denies injury or fall. Denies GI/GU symptoms.

## 2015-07-31 NOTE — ED Notes (Signed)
Patient left stating she hadn't been seen yet.  Patient was roomed at 11am.

## 2015-08-01 ENCOUNTER — Encounter (HOSPITAL_COMMUNITY): Payer: Self-pay | Admitting: Emergency Medicine

## 2015-08-01 ENCOUNTER — Emergency Department (HOSPITAL_COMMUNITY)
Admission: EM | Admit: 2015-08-01 | Discharge: 2015-08-01 | Disposition: A | Discharge status: Home / Self Care | Payer: Self-pay | Attending: Emergency Medicine | Admitting: Emergency Medicine

## 2015-08-01 DIAGNOSIS — Y929 Unspecified place or not applicable: Secondary | ICD-10-CM | POA: Insufficient documentation

## 2015-08-01 DIAGNOSIS — Y9389 Activity, other specified: Secondary | ICD-10-CM | POA: Insufficient documentation

## 2015-08-01 DIAGNOSIS — F329 Major depressive disorder, single episode, unspecified: Secondary | ICD-10-CM | POA: Insufficient documentation

## 2015-08-01 DIAGNOSIS — F17210 Nicotine dependence, cigarettes, uncomplicated: Secondary | ICD-10-CM | POA: Insufficient documentation

## 2015-08-01 DIAGNOSIS — Y999 Unspecified external cause status: Secondary | ICD-10-CM | POA: Insufficient documentation

## 2015-08-01 DIAGNOSIS — S300XXA Contusion of lower back and pelvis, initial encounter: Secondary | ICD-10-CM | POA: Insufficient documentation

## 2015-08-01 MED ORDER — KETOROLAC TROMETHAMINE 60 MG/2ML IM SOLN: 60.0000 mg | Freq: Once | INTRAMUSCULAR | Status: DC

## 2015-08-01 MED ORDER — DICLOFENAC SODIUM 75 MG PO TBEC: 75.0000 mg | DELAYED_RELEASE_TABLET | Freq: Two times a day (BID) | ORAL | Status: DC

## 2015-08-01 NOTE — ED Provider Notes (Signed)
CSN: [id number]     Arrival date & time [id number]/29/17  1314 History   First MD Initiated Contact with Patient 08/01/15 1334     Chief Complaint  Patient presents with  . Tailbone Pain     (Consider location/radiation/quality/duration/timing/severity/associated sxs/prior Treatment) The history is provided by the patient and medical records. No language interpreter was used.     Danielle Frederick is a 40 y.o. female  with a hx of kidney stones, UTI, PTSD presents to the Emergency Department complaining of gradual, persistent, tailbone pain onset yesterday after riding go-carts.  She reports the pain is a soreness.  She denies falling or other trauma. Pt reports taking tylenol and ibuprofen without relief.  Pt reports sitting makes the symptoms worse and nothing makes it better.  Pt denies open wounds, fever, chills, low back pain, radiation of the pain, weakness, numbness, loss of bowel or bladder.       Past Medical History  Diagnosis Date  . Endometriosis   . Kidney stones   . Urinary tract infection   . Depression   . Post traumatic stress disorder (PTSD)   . Depression    Past Surgical History  Procedure Laterality Date  . Laparoscopic endometriosis fulguration    . Cesarean section    . Tubal ligation    . Hysteroscopy w/ endometrial ablation    . Bilateral carpal tunnel release     History reviewed. No pertinent family history. Social History  Substance Use Topics  . Smoking status: Current Every Day Smoker -- 1.00 packs/day    Types: Cigarettes  . Smokeless tobacco: None  . Alcohol Use: Yes     Comment: occasionally   OB History    No data available     Review of Systems  Constitutional: Negative for fever, diaphoresis, appetite change, fatigue and unexpected weight change.  HENT: Negative for mouth sores.   Eyes: Negative for visual disturbance.  Respiratory: Negative for cough, chest tightness, shortness of breath and wheezing.   Cardiovascular: Negative for chest  pain.  Gastrointestinal: Negative for nausea, vomiting, abdominal pain, diarrhea and constipation.  Endocrine: Negative for polydipsia, polyphagia and polyuria.  Genitourinary: Negative for dysuria, urgency, frequency and hematuria.  Musculoskeletal: Positive for back pain ( Tailbone pain). Negative for neck stiffness.  Skin: Negative for rash.  Allergic/Immunologic: Negative for immunocompromised state.  Neurological: Negative for syncope, light-headedness and headaches.  Hematological: Does not bruise/bleed easily.  Psychiatric/Behavioral: Negative for sleep disturbance. The patient is not nervous/anxious.       Allergies  Tramadol  Home Medications   Prior to Admission medications   Medication Sig Start Date End Date Taking? Authorizing Provider  acetaminophen (TYLENOL) 500 MG tablet Take 500 mg by mouth every 6 (six) hours as needed for mild pain or moderate pain.    Historical Provider, MD  diclofenac (VOLTAREN) 75 MG EC tablet Take 1 tablet (75 mg total) by mouth 2 (two) times daily. 08/01/15   Arin Vanosdol, PA-C  HYDROcodone-acetaminophen (NORCO/VICODIN) 5-325 MG tablet Take 1 tablet by mouth every 4 (four) hours as needed. 05/02/15   Hope Orlene OchM Neese, NP  naproxen (NAPROSYN) 375 MG tablet Take 1 tablet (375 mg total) by mouth 2 (two) times daily. 05/02/15   Hope Orlene OchM Neese, NP   BP 105/75 mmHg  Pulse 80  Temp(Src) 97.9 F (36.6 C) (Oral)  Resp 15  Ht 5' (1.524 m)  Wt 43.999 kg  BMI 18.94 kg/m2  SpO2 100%  LMP 07/31/2015 Physical Exam  Constitutional:  She appears well-developed and well-nourished. No distress.  HENT:  Head: Normocephalic and atraumatic.  Mouth/Throat: Oropharynx is clear and moist. No oropharyngeal exudate.  Eyes: Conjunctivae are normal.  Neck: Normal range of motion. Neck supple.  Full ROM without pain  Cardiovascular: Normal rate, regular rhythm and intact distal pulses.   Pulmonary/Chest: Effort normal and breath sounds normal. No respiratory  distress. She has no wheezes.  Abdominal: Soft. She exhibits no distension. There is no tenderness.  Musculoskeletal:  Full range of motion of the T-spine and L-spine No midline or paraspinal tenderness to the  T-spine or L-spine TTP over the coccyx, but no deformity, instability or step off  Lymphadenopathy:    She has no cervical adenopathy.  Neurological: She is alert. She has normal reflexes.  Reflex Scores:      Bicep reflexes are 2+ on the right side and 2+ on the left side.      Brachioradialis reflexes are 2+ on the right side and 2+ on the left side.      Patellar reflexes are 2+ on the right side and 2+ on the left side.      Achilles reflexes are 2+ on the right side and 2+ on the left side. Speech is clear and goal oriented, follows commands Normal 5/5 strength in upper and lower extremities bilaterally including dorsiflexion and plantar flexion, strong and equal grip strength Sensation normal to light and sharp touch Moves extremities without ataxia, coordination intact Normal gait Normal balance No Clonus   Skin: Skin is warm and dry. No rash noted. She is not diaphoretic. No erythema.  Psychiatric: She has a normal mood and affect. Her behavior is normal.  Nursing note and vitals reviewed.   ED Course  Procedures (including critical care time)   MDM   Final diagnoses:  Coccyx contusion, initial encounter   Danielle Frederick presents with Nontraumatic coccyx pain.  Local exam is consistent with contusion. Highly doubt fracture of the coccyx. Discussed this with patient. I offered x-ray however do not believe this will change management. Patient declines. She has no midline or paraspinal lumbar pain. Highly doubt compression fracture of the L-spine. She has asked for narcotic pain control however I do not believe that this is a safe treatment of her pain. Will give Voltaren. I have recommended that she uses medication in conjunction with Tylenol and other conservative  therapies. Discussed reasons to return to the emergency department.  Dahlia ClientHannah Kejuan Bekker, PA-C 08/01/15 1541  Linwood DibblesJon Knapp, MD 08/01/15 [phone]

## 2015-08-01 NOTE — Discharge Instructions (Signed)
1. Medications: Voltaren, usual home medications 2. Treatment: rest, drink plenty of fluids, use ice and round pillow 3. Follow Up: Please followup with your primary doctor in 3 days for discussion of your diagnoses and further evaluation after today's visit; if you do not have a primary care doctor use the resource guide provided to find one;  Return to the ER for worsening back pain, difficulty walking, loss of bowel or bladder control or other concerning symptoms    Contusion A contusion is a deep bruise. Contusions are the result of a blunt injury to tissues and muscle fibers under the skin. The injury causes bleeding under the skin. The skin overlying the contusion may turn blue, purple, or yellow. Minor injuries will give you a painless contusion, but more severe contusions may stay painful and swollen for a few weeks.  CAUSES  This condition is usually caused by a blow, trauma, or direct force to an area of the body. SYMPTOMS  Symptoms of this condition include:  Swelling of the injured area.  Pain and tenderness in the injured area.  Discoloration. The area may have redness and then turn blue, purple, or yellow. DIAGNOSIS  This condition is diagnosed based on a physical exam and medical history. An X-ray, CT scan, or MRI may be needed to determine if there are any associated injuries, such as broken bones (fractures). TREATMENT  Specific treatment for this condition depends on what area of the body was injured. In general, the best treatment for a contusion is resting, icing, applying pressure to (compression), and elevating the injured area. This is often called the RICE strategy. Over-the-counter anti-inflammatory medicines may also be recommended for pain control.  HOME CARE INSTRUCTIONS   Rest the injured area.  If directed, apply ice to the injured area:  Put ice in a plastic bag.  Place a towel between your skin and the bag.  Leave the ice on for 20 minutes, 2-3 times  per day.  If directed, apply light compression to the injured area using an elastic bandage. Make sure the bandage is not wrapped too tightly. Remove and reapply the bandage as directed by your health care provider.  If possible, raise (elevate) the injured area above the level of your heart while you are sitting or lying down.  Take over-the-counter and prescription medicines only as told by your health care provider. SEEK MEDICAL CARE IF:  Your symptoms do not improve after several days of treatment.  Your symptoms get worse.  You have difficulty moving the injured area. SEEK IMMEDIATE MEDICAL CARE IF:   You have severe pain.  You have numbness in a hand or foot.  Your hand or foot turns pale or cold.   This information is not intended to replace advice given to you by your health care provider. Make sure you discuss any questions you have with your health care provider.   Document Released: 10/29/2004 Document Revised: 10/10/2014 Document Reviewed: 06/06/2014 Elsevier Interactive Patient Education [employer]2016 [employer]

## 2015-08-01 NOTE — ED Notes (Signed)
Patient complaining of pain to tailbone since riding go-carts on Tuesday. States she was here yesterday for same but left due to "waiting too long."

## 2015-09-24 ENCOUNTER — Emergency Department (HOSPITAL_COMMUNITY)
Admission: EM | Admit: 2015-09-24 | Discharge: 2015-09-24 | Disposition: A | Discharge status: Home / Self Care | Payer: Self-pay | Attending: Emergency Medicine | Admitting: Emergency Medicine

## 2015-09-24 ENCOUNTER — Encounter (HOSPITAL_COMMUNITY): Payer: Self-pay

## 2015-09-24 ENCOUNTER — Emergency Department (HOSPITAL_COMMUNITY): Payer: Self-pay

## 2015-09-24 DIAGNOSIS — F17210 Nicotine dependence, cigarettes, uncomplicated: Secondary | ICD-10-CM | POA: Insufficient documentation

## 2015-09-24 DIAGNOSIS — R109 Unspecified abdominal pain: Secondary | ICD-10-CM | POA: Insufficient documentation

## 2015-09-24 LAB — URINALYSIS, ROUTINE W REFLEX MICROSCOPIC
Bilirubin Urine: NEGATIVE
Glucose, UA: NEGATIVE mg/dL
KETONES UR: NEGATIVE mg/dL
LEUKOCYTES UA: NEGATIVE
Nitrite: NEGATIVE
Specific Gravity, Urine: 1.025 (ref 1.005–1.030)
pH: 6 (ref 5.0–8.0)

## 2015-09-24 LAB — COMPREHENSIVE METABOLIC PANEL
ALK PHOS: 54 U/L (ref 38–126)
ALT: 18 U/L (ref 14–54)
ANION GAP: 5 (ref 5–15)
AST: 18 U/L (ref 15–41)
Albumin: 4.1 g/dL (ref 3.5–5.0)
BILIRUBIN TOTAL: 0.3 mg/dL (ref 0.3–1.2)
BUN: 12 mg/dL (ref 6–20)
CALCIUM: 8.9 mg/dL (ref 8.9–10.3)
CO2: 26 mmol/L (ref 22–32)
CREATININE: 0.66 mg/dL (ref 0.44–1.00)
Chloride: 106 mmol/L (ref 101–111)
Glucose, Bld: 89 mg/dL (ref 65–99)
Potassium: 3.5 mmol/L (ref 3.5–5.1)
Sodium: 137 mmol/L (ref 135–145)
TOTAL PROTEIN: 6.6 g/dL (ref 6.5–8.1)

## 2015-09-24 LAB — CBC
HCT: 38.9 % (ref 36.0–46.0)
HEMOGLOBIN: 13.1 g/dL (ref 12.0–15.0)
MCH: 32 pg (ref 26.0–34.0)
MCHC: 33.7 g/dL (ref 30.0–36.0)
MCV: 94.9 fL (ref 78.0–100.0)
PLATELETS: 312 10*3/uL (ref 150–400)
RBC: 4.1 MIL/uL (ref 3.87–5.11)
RDW: 13.2 % (ref 11.5–15.5)
WBC: 7.4 10*3/uL (ref 4.0–10.5)

## 2015-09-24 LAB — URINE MICROSCOPIC-ADD ON: WBC, UA: NONE SEEN WBC/hpf (ref 0–5)

## 2015-09-24 LAB — PREGNANCY, URINE: PREG TEST UR: NEGATIVE

## 2015-09-24 MED ORDER — KETOROLAC TROMETHAMINE 30 MG/ML IJ SOLN: 30.0000 mg | Freq: Once | INTRAMUSCULAR | Status: DC

## 2015-09-24 MED ORDER — MORPHINE SULFATE (PF) 4 MG/ML IV SOLN
4.0000 mg | Freq: Once | INTRAVENOUS | Status: AC
  Administered 2015-09-24: 4 mg via INTRAVENOUS
  Filled 2015-09-24: qty 1

## 2015-09-24 MED ORDER — ONDANSETRON HCL 4 MG/2ML IJ SOLN
4.0000 mg | Freq: Once | INTRAMUSCULAR | Status: AC
  Administered 2015-09-24: 4 mg via INTRAVENOUS
  Filled 2015-09-24: qty 2

## 2015-09-24 NOTE — ED Triage Notes (Signed)
Pt c/o pain in r flank x 2 days.  Reports diarrhea yesterday and hematuria. Reports history of kidney stones.

## 2015-09-24 NOTE — Discharge Instructions (Signed)

## 2015-09-24 NOTE — ED Provider Notes (Signed)
Emergency Department Provider Note   I have reviewed the triage vital signs and the nursing notes.   HISTORY  Chief Complaint Flank Pain   HPI Danielle Frederick is a 40 y.o. female with PMH of kidney stones, PTSD presents to the emergency department for evaluation of right back and flank pain consistent with prior kidney stone pain. Patient reports been several years since her last stone. She has not required intervention on her 2 previous stones by urologist. Back pain began yesterday. It is intermittent and severe. Today she continues to have discomfort despite taking Tylenol and Motrin. She feels the pain is migrating around to her right flank. She has associated nausea but no vomiting. No fevers. No dysuria. She noted some dark urine.   Past Medical History:  Diagnosis Date  . Depression   . Depression   . Endometriosis   . Kidney stones   . Post traumatic stress disorder (PTSD)   . Urinary tract infection     There are no active problems to display for this patient.   Past Surgical History:  Procedure Laterality Date  . BILATERAL CARPAL TUNNEL RELEASE    . CESAREAN SECTION    . HYSTEROSCOPY W/ ENDOMETRIAL ABLATION    . LAPAROSCOPIC ENDOMETRIOSIS FULGURATION    . TUBAL LIGATION      Current Outpatient Rx  . Order #: [id number] Class: Historical Med  . Order #: [id number] Class: Historical Med  . Order #: [id number] Class: Print  . Order #: [id number] Class: Print  . Order #: [id number] Class: Print    Allergies Tramadol  No family history on file.  Social History Social History  Substance Use Topics  . Smoking status: Current Every Day Smoker    Packs/day: 1.00    Types: Cigarettes  . Smokeless tobacco: Never Used  . Alcohol use Yes     Comment: occasionally    Review of Systems  Constitutional: No fever/chills Eyes: No visual changes. ENT: No sore throat. Cardiovascular: Denies chest pain. Respiratory: Denies shortness of breath. Gastrointestinal: No  abdominal pain.  No nausea, no vomiting.  No diarrhea.  No constipation. Genitourinary: Negative for dysuria. Positive hematuria and right flank pain.  Musculoskeletal: Negative for back pain. Skin: Negative for rash. Neurological: Negative for headaches, focal weakness or numbness.  10-point ROS otherwise negative.  ____________________________________________   PHYSICAL EXAM:  VITAL SIGNS: ED Triage Vitals  Enc Vitals Group     BP 09/24/15 1133 119/99     Pulse Rate 09/24/15 1133 69     Resp 09/24/15 1133 18     Temp 09/24/15 1133 98.3 F (36.8 C)     Temp Source 09/24/15 1133 Oral     SpO2 09/24/15 1133 100 %     Weight 09/24/15 1133 100 lb (45.4 kg)     Height 09/24/15 1133 5' (1.524 m)     Pain Score 09/24/15 1134 8    Constitutional: Alert and oriented. Appears uncomfortable.  Eyes: Conjunctivae are normal.  Head: Atraumatic. Nose: No congestion/rhinnorhea. Mouth/Throat: Mucous membranes are moist.  Oropharynx non-erythematous. Neck: No stridor.   Cardiovascular: Normal rate, regular rhythm. Good peripheral circulation. Grossly normal heart sounds.   Respiratory: Normal respiratory effort.  No retractions. Lungs CTAB. Gastrointestinal: Soft and nontender. No distention.  Musculoskeletal: No lower extremity tenderness nor edema. No gross deformities of extremities. Neurologic:  Normal speech and language. No gross focal neurologic deficits are appreciated.  Skin:  Skin is warm, dry and intact. No rash noted. Psychiatric: Mood and affect are normal.  Speech and behavior are normal. ____________________________________________   LABS (all labs ordered are listed, but only abnormal results are displayed)  Labs Reviewed  URINALYSIS, ROUTINE W REFLEX MICROSCOPIC (NOT AT [facility]) - Abnormal; Notable for the following:       Result Value   APPearance HAZY (*)    Hgb urine dipstick LARGE (*)    Protein, ur TRACE (*)    All other components within normal limits  URINE  MICROSCOPIC-ADD ON - Abnormal; Notable for the following:    Squamous Epithelial / LPF TOO NUMEROUS TO COUNT (*)    Bacteria, UA FEW (*)    All other components within normal limits  COMPREHENSIVE METABOLIC PANEL  CBC  PREGNANCY, URINE   ____________________________________________  RADIOLOGY  Ct Renal Stone Study  Result Date: 09/24/2015 CLINICAL DATA:  Pain in the right flank over the last 2 days. Hematuria yesterday. EXAM: CT ABDOMEN AND PELVIS WITHOUT CONTRAST TECHNIQUE: Multidetector CT imaging of the abdomen and pelvis was performed following the standard protocol without IV contrast. COMPARISON:  09/09/2011 FINDINGS: Lower chest:  Normal Hepatobiliary: The liver appears normal without contrast, except for a small calcified granuloma at the dome. No calcified gallstones. Pancreas: Normal Spleen: Normal Adrenals/Urinary Tract: Adrenal glands are normal. Right kidney is normal except for a 1 mm stone in midportion. No cyst, mass, stone or hydronephrosis. Previously seen left renal calculus in the midportion measuring 3 x 4 mm is again demonstrated. Stomach/Bowel: Normal.  Appendix is normal. Vascular/Lymphatic: Aortic atherosclerosis, premature for age. IVC is normal. No retroperitoneal adenopathy. Reproductive: Uterus and adnexal regions are normal. Other: No ascites Musculoskeletal:  Normal IMPRESSION: No right-sided urinary tract pathology. 3 x 4 mm left renal stone, unchanged since 2013, nonobstructive. 1 mm stone in the midportion of the right kidney, nonobstructive. a Aortic atherosclerosis.  This is markedly premature for age. No abnormality seen to explain the patient's symptoms. Electronically Signed   By: Paulina Fusi M.D.   On: 09/24/2015 15:41    ____________________________________________   PROCEDURES  Procedure(s) performed:   Procedures  None ____________________________________________   INITIAL IMPRESSION / ASSESSMENT AND PLAN / ED COURSE  Pertinent labs &  imaging results that were available during my care of the patient were reviewed by me and considered in my medical decision making (see chart for details).  Patient resents to the emergency department for evaluation of right flank pain consistent with prior episode of kidney stones. Patient's labs are unremarkable with exception of hematuria. Presentation seems consistent with prior kidney stones. No intervention required in the past. Patient currently does not have any primary care physician or urology follow-up as an outpatient. For this reason I have elected to perform noncontrast CT scan of the abdomen and pelvis to assess stone size and position. Patient would require more urgent urology follow-up with larger stone unlikely to pass. No sign of acute infection. We will attempt to control pain here in the emergency department and reassess afterwards.  03:49 PM Patient with no identifiable reason for the patient's pain on CT scan. Appendix is normal. Unchanged nonobstructing stones compared to last CT scan. Normal uterus and adnexal regions. Patient's pain is under control. She will continue Tylenol and Motrin and follow-up in the free clinic as able. Return precautions and outpatient workup for hematuria. No evidence of urinary tract infection.  At this time, I do not feel there is any life-threatening condition present. I have reviewed and discussed all results (EKG, imaging, lab, urine as appropriate), exam findings with  patient. I have reviewed nursing notes and appropriate previous records.  I feel the patient is safe to be discharged home without further emergent workup. Discussed usual and customary return precautions. Patient and family (if present) verbalize understanding and are comfortable with this plan.  Patient will follow-up with their primary care provider. If they do not have a primary care provider, information for follow-up has been provided to them. All questions have been  answered.  ____________________________________________  FINAL CLINICAL IMPRESSION(S) / ED DIAGNOSES  Final diagnoses:  Right flank pain     MEDICATIONS GIVEN DURING THIS VISIT:  Medications  ondansetron (ZOFRAN) injection 4 mg (4 mg Intravenous Given 09/24/15 1536)  morphine 4 MG/ML injection 4 mg (4 mg Intravenous Given 09/24/15 1536)     NEW OUTPATIENT MEDICATIONS STARTED DURING THIS VISIT:  None   Note:  This document was prepared using Dragon voice recognition software and may include unintentional dictation errors.  Alona BeneJoshua Victor Langenbach, MD Emergency Medicine   Maia PlanJoshua G Johniece Hornbaker, MD 09/24/15 [phone]

## 2016-01-12 ENCOUNTER — Emergency Department (HOSPITAL_COMMUNITY)
Admission: EM | Admit: 2016-01-12 | Discharge: 2016-01-12 | Disposition: A | Discharge status: Other Acute Inpatient Hospital | Payer: Self-pay

## 2016-01-31 ENCOUNTER — Encounter (HOSPITAL_COMMUNITY): Payer: Self-pay

## 2016-01-31 ENCOUNTER — Emergency Department (HOSPITAL_COMMUNITY): Payer: Self-pay

## 2016-01-31 ENCOUNTER — Emergency Department (HOSPITAL_COMMUNITY)
Admission: EM | Admit: 2016-01-31 | Discharge: 2016-01-31 | Disposition: A | Discharge status: Home / Self Care | Payer: Self-pay | Attending: Emergency Medicine | Admitting: Emergency Medicine

## 2016-01-31 DIAGNOSIS — R059 Cough, unspecified: Secondary | ICD-10-CM

## 2016-01-31 DIAGNOSIS — F17210 Nicotine dependence, cigarettes, uncomplicated: Secondary | ICD-10-CM | POA: Insufficient documentation

## 2016-01-31 DIAGNOSIS — R05 Cough: Secondary | ICD-10-CM | POA: Insufficient documentation

## 2016-01-31 DIAGNOSIS — X503XXA Overexertion from repetitive movements, initial encounter: Secondary | ICD-10-CM | POA: Insufficient documentation

## 2016-01-31 DIAGNOSIS — Y990 Civilian activity done for income or pay: Secondary | ICD-10-CM | POA: Insufficient documentation

## 2016-01-31 DIAGNOSIS — Y929 Unspecified place or not applicable: Secondary | ICD-10-CM | POA: Insufficient documentation

## 2016-01-31 DIAGNOSIS — Y9389 Activity, other specified: Secondary | ICD-10-CM | POA: Insufficient documentation

## 2016-01-31 DIAGNOSIS — M7031 Other bursitis of elbow, right elbow: Secondary | ICD-10-CM | POA: Insufficient documentation

## 2016-01-31 MED ORDER — BENZONATATE 100 MG PO CAPS: 100.0000 mg | ORAL_CAPSULE | Freq: Three times a day (TID) | ORAL | 0 refills | Status: DC

## 2016-01-31 MED ORDER — ALBUTEROL SULFATE HFA 108 (90 BASE) MCG/ACT IN AERS
2.0000 | INHALATION_SPRAY | Freq: Once | RESPIRATORY_TRACT | Status: AC
  Administered 2016-01-31: 2 via RESPIRATORY_TRACT
  Filled 2016-01-31: qty 6.7

## 2016-01-31 MED ORDER — HYDROCODONE-ACETAMINOPHEN 5-325 MG PO TABS: ORAL_TABLET | ORAL | 0 refills | Status: DC

## 2016-01-31 MED ORDER — DICLOFENAC SODIUM 75 MG PO TBEC: 75.0000 mg | DELAYED_RELEASE_TABLET | Freq: Two times a day (BID) | ORAL | 0 refills | Status: DC

## 2016-01-31 NOTE — Discharge Instructions (Signed)
Alternate ice and heat to yoru elbow.  Wear the sling as needed, but not continuously.  Follow-up with the orthopedic doctor listed if not improving

## 2016-01-31 NOTE — ED Triage Notes (Signed)
Pt reports right elbow pain for approx week. NKI

## 2016-02-03 NOTE — ED Provider Notes (Signed)
[facility]-EMER[facility]ENCY DEPT Provider Note   CSN: [id number] Arrival date & time: 01/31/16  1210     History   Chief Complaint Chief Complaint  Patient presents with  . Arm Pain    HPI Danielle Frederick is a 41 y.o. female.  HPI   Danielle Frederick is a 41 y.o. female who presents to the Emergency Department complaining of right elbow pain for one week.  Pain is described as constant and worse with bending.  She reports frequent movement of her arm at her job.  She has tried otc medications without relief.  Pain radiates to her forearm at times.  She denies known trauma, swelling, hot or red joints, fever or extremity numbness. Also c/o cough and nasal congestion for several days.  Cough associated with wheezing and sputum production.  She denies chest pain, shortness of breath and fever.   Past Medical History:  Diagnosis Date  . Depression   . Depression   . Endometriosis   . Kidney stones   . Post traumatic stress disorder (PTSD)   . Urinary tract infection     There are no active problems to display for this patient.   Past Surgical History:  Procedure Laterality Date  . BILATERAL CARPAL TUNNEL RELEASE    . CESAREAN SECTION    . HYSTEROSCOPY W/ ENDOMETRIAL ABLATION    . LAPAROSCOPIC ENDOMETRIOSIS FULGURATION    . TUBAL LIGATION      OB History    No data available       Home Medications    Prior to Admission medications   Medication Sig Start Date End Date Taking? Authorizing Provider  acetaminophen (TYLENOL) 500 MG tablet Take 500 mg by mouth every 6 (six) hours as needed for mild pain or moderate pain.    Historical Provider, MD  benzonatate (TESSALON) 100 MG capsule Take 1 capsule (100 mg total) by mouth every 8 (eight) hours. 01/31/16   Torrie Namba, PA-C  diclofenac (VOLTAREN) 75 MG EC tablet Take 1 tablet (75 mg total) by mouth 2 (two) times daily. Take with food 01/31/16   Jalene Demo, PA-C  HYDROcodone-acetaminophen (NORCO/VICODIN) 5-325 MG tablet Take one  tab po q 4-6 hrs prn pain 01/31/16   Karaline Buresh, PA-C  ibuprofen (ADVIL,MOTRIN) 200 MG tablet Take 600 mg by mouth every 6 (six) hours as needed.    Historical Provider, MD    Family History No family history on file.  Social History Social History  Substance Use Topics  . Smoking status: Current Every Day Smoker    Packs/day: 1.00    Types: Cigarettes  . Smokeless tobacco: Never Used  . Alcohol use No     Allergies   Tramadol   Review of Systems Review of Systems  Constitutional: Negative for chills and fever.  Respiratory: Positive for cough and wheezing. Negative for chest tightness and shortness of breath.   Cardiovascular: Negative for chest pain.  Genitourinary: Negative for difficulty urinating and dysuria.  Musculoskeletal: Positive for arthralgias (right elbow pain). Negative for joint swelling and neck pain.  Skin: Negative for color change and wound.  Neurological: Negative for dizziness, weakness, numbness and headaches.  All other systems reviewed and are negative.    Physical Exam Updated Vital Signs BP 110/76 (BP Location: Left Arm)   Pulse 78   Resp 16   Ht 5' (1.524 m)   Wt 45.4 kg   SpO2 100%   BMI 19.53 kg/m   Physical Exam  Constitutional: She is oriented to  person, place, and time. She appears well-developed and well-nourished. No distress.  HENT:  Head: Atraumatic.  Neck: Normal range of motion. Neck supple.  Cardiovascular: Normal rate, regular rhythm and intact distal pulses.   Pulmonary/Chest: Effort normal. No respiratory distress. She has wheezes.  Expiratory and inspiratory wheezes, no rales.  No respiratory distress.  Musculoskeletal: She exhibits tenderness. She exhibits no edema or deformity.       Right elbow: She exhibits normal range of motion and no swelling. Tenderness found. Lateral epicondyle tenderness noted.  ttp of the lateral right elbow.  No erythema, crepitus or edema.  No excessive warmth of the joint.    Neurological: She is oriented to person, place, and time.  Skin: Skin is warm. No rash noted.  Nursing note and vitals reviewed.    ED Treatments / Results  Labs (all labs ordered are listed, but only abnormal results are displayed) Labs Reviewed - No data to display  EKG  EKG Interpretation None       Radiology No results found.  Procedures Procedures (including critical care time)  Medications Ordered in ED Medications  albuterol (PROVENTIL HFA;VENTOLIN HFA) 108 (90 Base) MCG/ACT inhaler 2 puff (2 puffs Inhalation Provided for home use 01/31/16 1417)     Initial Impression / Assessment and Plan / ED Course  I have reviewed the triage vital signs and the nursing notes.  Pertinent labs & imaging results that were available during my care of the patient were reviewed by me and considered in my medical decision making (see chart for details).  Clinical Course     Pt with likely inflammatory bursitis/tendonitis of the elbow.  No concerning sx's for septic joint.  NV intact.  Cough, vital stable.  No fever, tachycardia or hypoxia.  Appears stable for d/c.  Likely viral process.  Final Clinical Impressions(s) / ED Diagnoses   Final diagnoses:  Bursitis of right elbow, unspecified bursa  Cough    New Prescriptions Discharge Medication List as of 01/31/2016  2:04 PM       Dynastee Brummell Rowe Robert 02/03/16 2155    Donnetta Hutching, MD 02/04/16 0745

## 2016-02-12 ENCOUNTER — Emergency Department (HOSPITAL_COMMUNITY)
Admission: EM | Admit: 2016-02-12 | Discharge: 2016-02-12 | Disposition: A | Discharge status: Home / Self Care | Payer: Self-pay | Attending: Emergency Medicine | Admitting: Emergency Medicine

## 2016-02-12 ENCOUNTER — Encounter (HOSPITAL_COMMUNITY): Payer: Self-pay

## 2016-02-12 DIAGNOSIS — F17210 Nicotine dependence, cigarettes, uncomplicated: Secondary | ICD-10-CM | POA: Insufficient documentation

## 2016-02-12 DIAGNOSIS — M778 Other enthesopathies, not elsewhere classified: Secondary | ICD-10-CM | POA: Insufficient documentation

## 2016-02-12 DIAGNOSIS — Z79899 Other long term (current) drug therapy: Secondary | ICD-10-CM | POA: Insufficient documentation

## 2016-02-12 MED ORDER — DEXAMETHASONE 4 MG PO TABS: 4.0000 mg | ORAL_TABLET | Freq: Two times a day (BID) | ORAL | 0 refills | Status: DC

## 2016-02-12 MED ORDER — DICLOFENAC SODIUM 75 MG PO TBEC: 75.0000 mg | DELAYED_RELEASE_TABLET | Freq: Two times a day (BID) | ORAL | 0 refills | Status: DC

## 2016-02-12 MED ORDER — KETOROLAC TROMETHAMINE 60 MG/2ML IM SOLN
60.0000 mg | Freq: Once | INTRAMUSCULAR | Status: AC
  Administered 2016-02-12: 60 mg via INTRAMUSCULAR
  Filled 2016-02-12: qty 2

## 2016-02-12 MED ORDER — IBUPROFEN 600 MG PO TABS: 600.0000 mg | ORAL_TABLET | Freq: Four times a day (QID) | ORAL | 0 refills | Status: DC

## 2016-02-12 MED ORDER — CYCLOBENZAPRINE HCL 10 MG PO TABS: 10.0000 mg | ORAL_TABLET | Freq: Three times a day (TID) | ORAL | 0 refills | Status: DC

## 2016-02-12 MED ORDER — ONDANSETRON HCL 4 MG PO TABS
4.0000 mg | ORAL_TABLET | Freq: Once | ORAL | Status: AC
  Administered 2016-02-12: 4 mg via ORAL
  Filled 2016-02-12: qty 1

## 2016-02-12 MED ORDER — DEXAMETHASONE SODIUM PHOSPHATE 4 MG/ML IJ SOLN
8.0000 mg | Freq: Once | INTRAMUSCULAR | Status: AC
  Administered 2016-02-12: 8 mg via INTRAMUSCULAR
  Filled 2016-02-12: qty 2

## 2016-02-12 NOTE — ED Notes (Signed)
Pt refused to take sling and ace wrap.  PA notified that pt says it made her arm hurt worse.

## 2016-02-12 NOTE — Discharge Instructions (Signed)
Your examination today is consistent with tendinitis due to overuse. Please use the Ace bandage and the sling for the next 5 days. Use decadron 2 times daily, and ibuprofen four times daily with food. Use Flexeril every 6 hours as needed for spasm pain in the elbow area. Flexeril may cause drowsiness, please do not drive, operate machinery, or participate in activities requiring concentration when taking this medication. Please see the orthopedic specialist for additional evaluation concerning your tendinitis.

## 2016-02-12 NOTE — ED Provider Notes (Signed)
[facility] Provider Note   CSN: [id number] Arrival date & time: 02/12/16  0831     History   Chief Complaint No chief complaint on file.   HPI Danielle Frederick is a 41 y.o. female.  Patient is a 41 year old female who presents to the emergency department with a complaint of left elbow pain. The patient states that she was seen in the emergency department approximately 2 weeks ago with right elbow pain. She was diagnosed with a possible bursitis. She was treated with pain medication and anti-inflammatory medication she states that it helped for a while, but the problem continued. It is of note that she continues to work. 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 She is right-hand dominant. The patient denies any new injury. She's not had any recent operations or procedures involving her right upper extremity. She has pain when she grips and pain when she moves her arm in certain ways, but she is not dropping objects. She has no unusual numbness or tingling involving the right upper extremity.   The history is provided by the patient.    Past Medical History:  Diagnosis Date  . Depression   . Depression   . Endometriosis   . Kidney stones   . Post traumatic stress disorder (PTSD)   . Urinary tract infection     There are no active problems to display for this patient.   Past Surgical History:  Procedure Laterality Date  . BILATERAL CARPAL TUNNEL RELEASE    . CESAREAN SECTION    . HYSTEROSCOPY W/ ENDOMETRIAL ABLATION    . LAPAROSCOPIC ENDOMETRIOSIS FULGURATION    . TUBAL LIGATION      OB History    No data available       Home Medications    Prior to Admission medications   Medication Sig Start Date End Date Taking? Authorizing Provider  acetaminophen (TYLENOL) 500 MG tablet Take 500 mg by mouth every 6 (six) hours as needed for mild pain or moderate pain.    Historical Provider, MD  benzonatate (TESSALON) 100 MG capsule Take 1  capsule (100 mg total) by mouth every 8 (eight) hours. 01/31/16   Tammy Triplett, PA-C  diclofenac (VOLTAREN) 75 MG EC tablet Take 1 tablet (75 mg total) by mouth 2 (two) times daily. Take with food 01/31/16   Tammy Triplett, PA-C  HYDROcodone-acetaminophen (NORCO/VICODIN) 5-325 MG tablet Take one tab po q 4-6 hrs prn pain 01/31/16   Tammy Triplett, PA-C  ibuprofen (ADVIL,MOTRIN) 200 MG tablet Take 600 mg by mouth every 6 (six) hours as needed.    Historical Provider, MD    Family History No family history on file.  Social History Social History  Substance Use Topics  . Smoking status: Current Every Day Smoker    Packs/day: 1.00    Types: Cigarettes  . Smokeless tobacco: Never Used  . Alcohol use No     Allergies   Tramadol   Review of Systems Review of Systems  Constitutional: Negative for activity change.       All ROS Neg except as noted in HPI  HENT: Negative for nosebleeds.   Eyes: Negative for photophobia and discharge.  Respiratory: Negative for cough, shortness of breath and wheezing.   Cardiovascular: Negative for chest pain and palpitations.  Gastrointestinal: Negative for abdominal pain and blood in stool.  Genitourinary: Negative for dysuria, frequency and hematuria.  Musculoskeletal: Positive for arthralgias. Negative for back pain and neck pain.  Skin: Negative.   Neurological: Negative for dizziness, seizures and speech difficulty.  Psychiatric/Behavioral: Negative for confusion and hallucinations.       Depression     Physical Exam Updated Vital Signs   Physical Exam  Constitutional: She is oriented to person, place, and time. She appears well-developed and well-nourished.  Non-toxic appearance.  HENT:  Head: Normocephalic.  Right Ear: Tympanic membrane and external ear normal.  Left Ear: Tympanic membrane and external ear normal.  Eyes: EOM and lids are normal. Pupils are equal, round, and reactive to light.  Neck: Normal range of motion. Neck  supple. Carotid bruit is not present.  Cardiovascular: Normal rate, regular rhythm, normal heart sounds, intact distal pulses and normal pulses.   Pulmonary/Chest: Breath sounds normal. No respiratory distress.  Abdominal: Soft. Bowel sounds are normal. There is no tenderness. There is no guarding.  Musculoskeletal: Normal range of motion.       Left elbow: She exhibits no effusion and no deformity. Tenderness found. Lateral epicondyle tenderness noted.  Lymphadenopathy:       Head (right side): No submandibular adenopathy present.       Head (left side): No submandibular adenopathy present.    She has no cervical adenopathy.  Neurological: She is alert and oriented to person, place, and time. She has normal strength. No cranial nerve deficit or sensory deficit.  Skin: Skin is warm and dry.  Psychiatric: She has a normal mood and affect. Her speech is normal.  Nursing note and vitals reviewed.    ED Treatments / Results  Labs (all labs ordered are listed, but only abnormal results are displayed) Labs Reviewed - No data to display  EKG  EKG Interpretation None       Radiology No results found.  Procedures Procedures (including critical care time)  Medications Ordered in ED Medications - No data to display   Initial Impression / Assessment and Plan / ED Course  I have reviewed the triage vital signs and the nursing notes.  Pertinent labs & imaging results that were available during my care of the patient were reviewed by me and considered in my medical decision making (see chart for details).  Clinical Course     **I have reviewed nursing notes, vital signs, and all appropriate lab and imaging results for this patient.*  Final Clinical Impressions(s) / ED Diagnoses MDM Exam is consistent with over use left elbow tendonitis. No hot joint. No noted injury. Plan is for ACE wrap support and arm sling. Pt given IM toradol and decadron in ED. Rx for decadron, diclofenac,  and flexeril given to the patient. Pt again referred to orthopedics for additional evaluationl   Final diagnoses:  None    New Prescriptions New Prescriptions   No medications on file     Ivery QualeHobson Yeudiel Mateo, PA-C 02/12/16 [id number]    Bethann BerkshireJoseph Zammit, MD 02/14/16 [phone]

## 2016-02-12 NOTE — ED Triage Notes (Signed)
Pt reports pain in r elbow for several weeks.  Was diagnosed with bursitis 2 weeks ago.  [facility] but did not have her paperwork complete.

## 2016-02-25 ENCOUNTER — Encounter: Payer: Self-pay | Admitting: Physician Assistant

## 2016-02-25 ENCOUNTER — Ambulatory Visit: Payer: Self-pay | Admitting: Physician Assistant

## 2016-02-25 VITALS — BP 120/74 | HR 79 | Temp 98.1°F | Ht 60.0 in | Wt 105.8 lb

## 2016-02-25 DIAGNOSIS — M25521 Pain in right elbow: Secondary | ICD-10-CM

## 2016-02-25 DIAGNOSIS — Z13220 Encounter for screening for lipoid disorders: Secondary | ICD-10-CM

## 2016-02-25 DIAGNOSIS — F17219 Nicotine dependence, cigarettes, with unspecified nicotine-induced disorders: Secondary | ICD-10-CM

## 2016-02-25 DIAGNOSIS — Z131 Encounter for screening for diabetes mellitus: Secondary | ICD-10-CM

## 2016-02-25 LAB — GLUCOSE, POCT (MANUAL RESULT ENTRY): POC GLUCOSE: 99 mg/dL (ref 70–99)

## 2016-02-25 MED ORDER — DICLOFENAC SODIUM 75 MG PO TBEC: 75.0000 mg | DELAYED_RELEASE_TABLET | Freq: Two times a day (BID) | ORAL | 1 refills | Status: AC

## 2016-02-25 NOTE — Progress Notes (Signed)
BP 120/74 (BP Location: Left Arm, Patient Position: Sitting, Cuff Size: Normal)   Pulse 79   Temp 98.1 F (36.7 C)   Ht 5' (1.524 m)   Wt 105 lb 12 oz (48 kg)   SpO2 98%   BMI 20.65 kg/m    Subjective:    Patient ID: Benny Lennertina Ing, female    DOB: 04/11/1975, 41 y.o.   MRN: [id number]  HPI: Benny Lennertina Cunning is a 41 y.o. 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   Having B elbow pain since about November.  R is much worse but sometimes L hurts also.  She is R hand dominant.   Pt states never had chol checked.   Pt Thinks her last PAP was 2 yr ago  Discussed recent abdominal CT with pt- specifically the aortic atherosclerosis, premature for age  Relevant past medical, surgical, family and social history reviewed and updated as indicated. Interim medical history since our last visit reviewed. Allergies and medications reviewed and updated.  No current outpatient prescriptions on file.  Review of Systems  Constitutional: Negative for appetite change, chills, diaphoresis, fatigue, fever and unexpected weight change.  HENT: Positive for dental problem and mouth sores. Negative for congestion, drooling, ear pain, facial swelling, hearing loss, sneezing, sore throat, trouble swallowing and voice change.   Eyes: Positive for visual disturbance. Negative for pain, discharge, redness and itching.  Respiratory: Positive for shortness of breath. Negative for cough, choking and wheezing.   Cardiovascular: Negative for chest pain, palpitations and leg swelling.  Gastrointestinal: Positive for abdominal pain. Negative for blood in stool, constipation, diarrhea and vomiting.  Endocrine: Negative for cold intolerance, heat intolerance and polydipsia.  Genitourinary: Negative for decreased urine volume, dysuria and hematuria.  Musculoskeletal: Positive for arthralgias. Negative for back  pain and gait problem.  Skin: Negative for rash.  Allergic/Immunologic: Negative for environmental allergies.  Neurological: Negative for seizures, syncope, light-headedness and headaches.  Hematological: Negative for adenopathy.  Psychiatric/Behavioral: Negative for agitation, dysphoric mood and suicidal ideas. The patient is not nervous/anxious.     Per HPI unless specifically indicated above     Objective:    BP 120/74 (BP Location: Left Arm, Patient Position: Sitting, Cuff Size: Normal)   Pulse 79   Temp 98.1 F (36.7 C)   Ht 5' (1.524 m)   Wt 105 lb 12 oz (48 kg)   SpO2 98%   BMI 20.65 kg/m   Wt Readings from Last 3 Encounters:  02/25/16 105 lb 12 oz (48 kg)  02/12/16 100 lb (45.4 kg)  01/31/16 100 lb (45.4 kg)    Physical Exam  Constitutional: She is oriented to person, place, and time. She appears well-developed and well-nourished.  HENT:  Head: Normocephalic and atraumatic.  Mouth/Throat: Oropharynx is clear and moist. No oropharyngeal exudate.  Eyes: Conjunctivae and EOM are normal. Pupils are equal, round, and reactive to light.  Neck: Neck supple. No thyromegaly present.  Cardiovascular: Normal rate and regular rhythm.   Pulmonary/Chest: Effort normal and breath sounds normal.  Abdominal: Soft. Bowel sounds are normal. She exhibits no mass. There is no hepatosplenomegaly. There is no tenderness.  Musculoskeletal: She exhibits no edema.       Right elbow: She exhibits normal range of motion, no swelling, no effusion, no deformity and no laceration. Tenderness found.  Lymphadenopathy:    She has no cervical adenopathy.  Neurological: She is alert and oriented to person, place, and time. Gait normal.  Skin: Skin is warm and dry.  Psychiatric: She has a normal mood and affect. Her behavior is normal.  Vitals reviewed.   Results for orders placed or performed in visit on 02/25/16  POCT Glucose (CBG)  Result Value Ref Range   POC Glucose 99 70 - 99 mg/dl       Assessment & Plan:   Encounter Diagnoses  Name Primary?  . Right elbow pain Yes  . Cigarette nicotine dependence with nicotine-induced disorder   . Screening for diabetes mellitus (DM)   . Screening cholesterol level     -Record request sent to [facility] for PAP -will get Baseline labs -Counseled smoking cessation -Gave [facility] discount application -Refer to ortho for her elbow -rx diclofenac and ice to elbow -F/u 1 month.  RTO sooner prn

## 2016-02-25 NOTE — Patient Instructions (Signed)
Ice the Elbow 2-3 times/day   Elbow Bursitis Introduction Elbow bursitis is inflammation of the fluid-filled sac (bursa) between the tip of your elbow bone (olecranon) and your skin. Elbow bursitis may also be called olecranon bursitis. Normally, the olecranon bursa has only a small amount of fluid in it to cushion and protect your elbow bone. Elbow bursitis causes fluid to build up inside the bursa. Over time, this swelling and inflammation can cause pain when you bend or lean on your elbow. What are the causes? Elbow bursitis may be caused by:  Elbow injury (acute trauma).  Leaning on hard surfaces for long periods of time.  Infection from an injury that breaks the skin near your elbow.  A bone growth (spur) that forms at the tip of your elbow.  A medical condition that causes inflammation in your body, such as gout or rheumatoid arthritis. The cause may also be unknown. What are the signs or symptoms? The first sign of elbow bursitis is usually swelling over the tip of your elbow. This can grow to be the size of a golf ball. This may start suddenly or develop gradually. You may also have:  Pain when bending or leaning on your elbow.  Restricted movement of your elbow. If your bursitis is caused by an infection, symptoms may also include:  Redness, warmth, and tenderness of the elbow.  Drainage of pus from the swollen area over your elbow, if the skin breaks open. How is this diagnosed? Your health care provider may be able to diagnose elbow bursitis based on your signs and symptoms, especially if you have recently been injured. Your health care provider will also do a physical exam. This may include:  X-rays to look for a bone spur or a bone fracture.  Draining fluid from the bursa to test it for infection.  Blood tests to rule out gout or rheumatoid arthritis. How is this treated? Treatment for elbow bursitis depends on the cause. Treatment may include:  Medicines. These  may include:  Over-the-counter medicines to relieve pain and inflammation.  Antibiotic medicines to fight infection.  Injections of anti-inflammatory medicines (steroids).  Wrapping your elbow with a bandage.  Draining fluid from the bursa.  Wearing elbow pads. If your bursitis does not get better with treatment, surgery may be needed to remove the bursa. Follow these instructions at home:  Take medicines only as directed by your health care provider.  If you were prescribed an antibiotic medicine, finish all of it even if you start to feel better.  If your bursitis is caused by an injury, rest your elbow and wear your bandage as directed by your health care provider. You may alsoapply ice to the injured area as directed by your health care provider:  Put ice in a plastic bag.  Place a towel between your skin and the bag.  Leave the ice on for 20 minutes, 2-3 times per day.  Avoid any activities that cause elbow pain.  Use elbow pads or elbow wraps to cushion your elbow. Contact a health care provider if:  You have a fever.  Your symptoms do not get better with treatment.  Your pain or swelling gets worse.  Your elbow pain or swelling goes away and then returns.  You have drainage of pus from the swollen area over your elbow. This information is not intended to replace advice given to you by your health care provider. Make sure you discuss any questions you have with your health care  provider. Document Released: 02/18/2006 Document Revised: 06/27/2015 Document Reviewed: 09/27/2013  2017 Elsevier

## 2016-03-24 ENCOUNTER — Encounter: Payer: Self-pay | Admitting: Physician Assistant

## 2016-03-24 ENCOUNTER — Ambulatory Visit: Payer: Self-pay | Admitting: Physician Assistant

## 2016-03-24 VITALS — BP 106/68 | HR 88 | Temp 98.1°F | Ht 60.0 in | Wt 101.0 lb

## 2016-03-24 DIAGNOSIS — Z91199 Patient's noncompliance with other medical treatment and regimen due to unspecified reason: Secondary | ICD-10-CM

## 2016-03-24 DIAGNOSIS — Z9119 Patient's noncompliance with other medical treatment and regimen: Secondary | ICD-10-CM

## 2016-03-24 NOTE — Progress Notes (Signed)
   BP 106/68 (BP Location: Left Arm, Patient Position: Sitting, Cuff Size: Normal)   Pulse 88   Temp 98.1 F (36.7 C) (Other (Comment))   Ht 5' (1.524 m)   Wt 101 lb (45.8 kg)   SpO2 98%   BMI 19.73 kg/m    Subjective:    Patient ID: Danielle Frederick, female    DOB: 09/04/1975, 41 y.o.   MRN: [id number]  HPI: Danielle Frederick is a 41 y.o. female presenting on 03/24/2016 for Follow-up   HPI   Pt did not get labs drawn Pt did not turn in [facility] discount application  Relevant past medical, surgical, family and social history reviewed and updated as indicated. Interim medical history since our last visit reviewed. Allergies and medications reviewed and updated.   Current Outpatient Prescriptions:  .  diclofenac (VOLTAREN) 75 MG EC tablet, Take 1 tablet (75 mg total) by mouth 2 (two) times daily., Disp: 60 tablet, Rfl: 1   Review of Systems  Constitutional: Negative for appetite change, chills, diaphoresis, fatigue, fever and unexpected weight change.  HENT: Positive for dental problem. Negative for congestion, drooling, ear pain, facial swelling, hearing loss, mouth sores, sneezing, sore throat, trouble swallowing and voice change.   Eyes: Positive for visual disturbance. Negative for pain, discharge, redness and itching.  Respiratory: Negative for cough, choking, shortness of breath and wheezing.   Cardiovascular: Negative for chest pain, palpitations and leg swelling.  Gastrointestinal: Negative for abdominal pain, blood in stool, constipation, diarrhea and vomiting.  Endocrine: Negative for cold intolerance, heat intolerance and polydipsia.  Genitourinary: Negative for decreased urine volume, dysuria and hematuria.  Musculoskeletal: Negative for arthralgias, back pain and gait problem.  Skin: Negative for rash.  Allergic/Immunologic: Negative for environmental allergies.  Neurological: Positive for headaches. Negative for seizures, syncope and light-headedness.  Hematological:  Negative for adenopathy.  Psychiatric/Behavioral: Negative for agitation, dysphoric mood and suicidal ideas. The patient is not nervous/anxious.     Per HPI unless specifically indicated above     Objective:    BP 106/68 (BP Location: Left Arm, Patient Position: Sitting, Cuff Size: Normal)   Pulse 88   Temp 98.1 F (36.7 C) (Other (Comment))   Ht 5' (1.524 m)   Wt 101 lb (45.8 kg)   SpO2 98%   BMI 19.73 kg/m   Wt Readings from Last 3 Encounters:  03/24/16 101 lb (45.8 kg)  02/25/16 105 lb 12 oz (48 kg)  02/12/16 100 lb (45.4 kg)    Physical Exam  Constitutional: She is oriented to person, place, and time. She appears well-developed and well-nourished.  Pulmonary/Chest: Effort normal.  Neurological: She is alert and oriented to person, place, and time.  Skin: Skin is warm and dry.  Psychiatric: She has a normal mood and affect. Her behavior is normal. Thought content normal.  Nursing note and vitals reviewed.   Results for orders placed or performed in visit on 02/25/16  POCT Glucose (CBG)  Result Value Ref Range   POC Glucose 99 70 - 99 mg/dl      Assessment & Plan:   Encounter Diagnosis  Name Primary?  . Personal history of noncompliance with medical treatment, presenting hazards to health Yes     -pt to get labs drawn -pt to Turn in [facility] discount applicaton -pt Needs mammogram.  Will order at next OV -Pap is UTD -F/u 1 month.  RTO sooner prn

## 2016-04-13 ENCOUNTER — Encounter: Payer: Self-pay | Admitting: Physician Assistant

## 2016-04-21 ENCOUNTER — Ambulatory Visit: Payer: Self-pay | Admitting: Physician Assistant

## 2016-04-21 ENCOUNTER — Encounter: Payer: Self-pay | Admitting: Physician Assistant

## 2016-04-21 VITALS — BP 100/68 | HR 70 | Temp 97.9°F | Ht 60.0 in | Wt 101.5 lb

## 2016-04-21 DIAGNOSIS — Z9119 Patient's noncompliance with other medical treatment and regimen: Secondary | ICD-10-CM

## 2016-04-21 DIAGNOSIS — Z91199 Patient's noncompliance with other medical treatment and regimen due to unspecified reason: Secondary | ICD-10-CM

## 2016-04-21 DIAGNOSIS — F17219 Nicotine dependence, cigarettes, with unspecified nicotine-induced disorders: Secondary | ICD-10-CM

## 2016-04-21 NOTE — Progress Notes (Signed)
   BP 100/68 (BP Location: Left Arm, Patient Position: Sitting, Cuff Size: Normal)   Pulse 70   Temp 97.9 F (36.6 C) (Other (Comment))   Ht 5' (1.524 m)   Wt 101 lb 8 oz (46 kg)   SpO2 98%   BMI 19.82 kg/m    Subjective:    Patient ID: Danielle Frederick, female    DOB: 12/18/1975, 41 y.o.   MRN: [id number]  HPI: Danielle Frederick is a 41 y.o. female presenting on 04/21/2016 for Follow-up   HPI  Relevant past medical, surgical, family and social history reviewed and updated as indicated. Interim medical history since our last visit reviewed. Allergies and medications reviewed and updated.   Current Outpatient Prescriptions:  .  diclofenac (VOLTAREN) 75 MG EC tablet, Take 1 tablet (75 mg total) by mouth 2 (two) times daily., Disp: 60 tablet, Rfl: 1   Review of Systems  Constitutional: Negative for appetite change, chills, diaphoresis, fatigue, fever and unexpected weight change.  HENT: Positive for dental problem. Negative for congestion, drooling, ear pain, facial swelling, hearing loss, mouth sores, sneezing, sore throat, trouble swallowing and voice change.   Eyes: Positive for redness and itching. Negative for pain, discharge and visual disturbance.  Respiratory: Positive for cough. Negative for choking, shortness of breath and wheezing.   Cardiovascular: Negative for chest pain, palpitations and leg swelling.  Gastrointestinal: Negative for abdominal pain, blood in stool, constipation, diarrhea and vomiting.  Endocrine: Negative for cold intolerance, heat intolerance and polydipsia.  Genitourinary: Negative for decreased urine volume, dysuria and hematuria.  Musculoskeletal: Negative for arthralgias, back pain and gait problem.  Skin: Negative for rash.  Allergic/Immunologic: Positive for environmental allergies.  Neurological: Positive for headaches. Negative for seizures, syncope and light-headedness.  Hematological: Negative for adenopathy.  Psychiatric/Behavioral: Positive  for agitation. Negative for dysphoric mood and suicidal ideas. The patient is nervous/anxious.     Per HPI unless specifically indicated above     Objective:    BP 100/68 (BP Location: Left Arm, Patient Position: Sitting, Cuff Size: Normal)   Pulse 70   Temp 97.9 F (36.6 C) (Other (Comment))   Ht 5' (1.524 m)   Wt 101 lb 8 oz (46 kg)   SpO2 98%   BMI 19.82 kg/m   Wt Readings from Last 3 Encounters:  04/21/16 101 lb 8 oz (46 kg)  03/24/16 101 lb (45.8 kg)  02/25/16 105 lb 12 oz (48 kg)    Physical Exam  Constitutional: She is oriented to person, place, and time. She appears well-developed and well-nourished.  HENT:  Head: Normocephalic and atraumatic.  Pulmonary/Chest: Effort normal.  Neurological: She is alert and oriented to person, place, and time.  Skin: Skin is warm and dry.  Psychiatric: She has a normal mood and affect. Her behavior is normal.  Nursing note and vitals reviewed.       Assessment & Plan:   Encounter Diagnoses  Name Primary?  . Personal history of noncompliance with medical treatment, presenting hazards to health Yes  . Cigarette nicotine dependence with nicotine-induced disorder      -pt counseled to Get labs drawn (that were ordered in January) -pt counseled to turn in [employer] application (that was given to her in January) -follow up 1 month

## 2016-06-22 ENCOUNTER — Encounter (HOSPITAL_COMMUNITY): Payer: Self-pay | Admitting: Emergency Medicine

## 2016-06-22 ENCOUNTER — Emergency Department (HOSPITAL_COMMUNITY)
Admission: EM | Admit: 2016-06-22 | Discharge: 2016-06-22 | Disposition: A | Discharge status: Home / Self Care | Payer: Self-pay | Attending: Emergency Medicine | Admitting: Emergency Medicine

## 2016-06-22 DIAGNOSIS — F1123 Opioid dependence with withdrawal: Secondary | ICD-10-CM | POA: Insufficient documentation

## 2016-06-22 DIAGNOSIS — F17210 Nicotine dependence, cigarettes, uncomplicated: Secondary | ICD-10-CM | POA: Insufficient documentation

## 2016-06-22 LAB — COMPREHENSIVE METABOLIC PANEL
ALBUMIN: 4.2 g/dL (ref 3.5–5.0)
ALT: 13 U/L — ABNORMAL LOW (ref 14–54)
ANION GAP: 8 (ref 5–15)
AST: 22 U/L (ref 15–41)
Alkaline Phosphatase: 55 U/L (ref 38–126)
BILIRUBIN TOTAL: 0.6 mg/dL (ref 0.3–1.2)
BUN: 6 mg/dL (ref 6–20)
CO2: 22 mmol/L (ref 22–32)
Calcium: 9.4 mg/dL (ref 8.9–10.3)
Chloride: 107 mmol/L (ref 101–111)
Creatinine, Ser: 0.68 mg/dL (ref 0.44–1.00)
GFR calc Af Amer: >60 mL/min (ref >60)
GFR calc non Af Amer: >60 mL/min (ref >60)
GLUCOSE: 124 mg/dL — AB (ref 65–99)
POTASSIUM: 3.2 mmol/L — AB (ref 3.5–5.1)
SODIUM: 137 mmol/L (ref 135–145)
TOTAL PROTEIN: 7 g/dL (ref 6.5–8.1)

## 2016-06-22 LAB — CBC
HCT: 39.9 % (ref 36.0–46.0)
HEMOGLOBIN: 13.9 g/dL (ref 12.0–15.0)
MCH: 32.3 pg (ref 26.0–34.0)
MCHC: 34.8 g/dL (ref 30.0–36.0)
MCV: 92.6 fL (ref 78.0–100.0)
Platelets: 285 10*3/uL (ref 150–400)
RBC: 4.31 MIL/uL (ref 3.87–5.11)
RDW: 12.9 % (ref 11.5–15.5)
WBC: 7.4 10*3/uL (ref 4.0–10.5)

## 2016-06-22 LAB — RAPID URINE DRUG SCREEN, HOSP PERFORMED
AMPHETAMINES: NOT DETECTED
BENZODIAZEPINES: POSITIVE — AB
Barbiturates: NOT DETECTED
COCAINE: POSITIVE — AB
OPIATES: POSITIVE — AB
TETRAHYDROCANNABINOL: POSITIVE — AB

## 2016-06-22 LAB — ETHANOL: Alcohol, Ethyl (B): <5 mg/dL (ref <5)

## 2016-06-22 MED ORDER — PROMETHAZINE HCL 25 MG PO TABS: 25.0000 mg | ORAL_TABLET | Freq: Four times a day (QID) | ORAL | 0 refills | Status: AC | PRN

## 2016-06-22 MED ORDER — POTASSIUM CHLORIDE CRYS ER 20 MEQ PO TBCR
40.0000 meq | EXTENDED_RELEASE_TABLET | Freq: Once | ORAL | Status: AC
  Administered 2016-06-22: 40 meq via ORAL
  Filled 2016-06-22: qty 2

## 2016-06-22 MED ORDER — CLONIDINE HCL 0.1 MG PO TABS: 0.1000 mg | ORAL_TABLET | Freq: Two times a day (BID) | ORAL | 0 refills | Status: AC | PRN

## 2016-06-22 MED ORDER — PROMETHAZINE HCL 25 MG/ML IJ SOLN
25.0000 mg | Freq: Once | INTRAMUSCULAR | Status: AC
  Administered 2016-06-22: 25 mg via INTRAMUSCULAR
  Filled 2016-06-22: qty 1

## 2016-06-22 NOTE — ED Provider Notes (Signed)
[facility]-EMERGENCY DEPT Provider Note   CSN: [id number] Arrival date & time: 06/22/16  1216     History   Chief Complaint Chief Complaint  Patient presents with  . Addiction Problem    HPI Danielle Frederick is a 41 y.o. female.  The history is provided by the patient. No language interpreter was used.   Danielle Frederick is a 41 y.o. female who presents to the Emergency Department complaining of addiction problem.  She is seeking help for detox from opiates. Over the last 5 years she has been heavily using opiates, Roxicet, morphine, heroin. She takes these orally or snorts them. Her last use was yesterday. Over the last week she has been trying to come off these and she has been starting to drink and take Xanax. She does not have a long history of alcohol or Xanax abuse. When she tries to stop taking the opiates she develops shaking chills, nausea, vomiting. She denies any fevers, chest pain, shortness of breath. She has no medical problems and takes no medications.  She denies any SI or HI. Past Medical History:  Diagnosis Date  . Anxiety   . Depression   . Depression   . Endometriosis   . Kidney stones   . Post traumatic stress disorder (PTSD)   . Urinary tract infection     Patient Active Problem List   Diagnosis Date Noted  . Cigarette nicotine dependence with nicotine-induced disorder 02/25/2016    Past Surgical History:  Procedure Laterality Date  . BILATERAL CARPAL TUNNEL RELEASE    . CESAREAN SECTION     2x  . HYSTEROSCOPY W/ ENDOMETRIAL ABLATION    . LAPAROSCOPIC ENDOMETRIOSIS FULGURATION    . TUBAL LIGATION      OB History    No data available       Home Medications    Prior to Admission medications   Medication Sig Start Date End Date Taking? Authorizing Provider  diclofenac (VOLTAREN) 75 MG EC tablet Take 1 tablet (75 mg total) by mouth 2 (two) times daily. 02/25/16   Jacquelin HawkingMcElroy, Shannon, PA-C    Family History Family History  Problem Relation Age of Onset   . COPD Mother   . Hypertension Mother   . Cancer Father        Liver and esophageal Cancer  . Hyperlipidemia Father   . Heart failure Paternal Aunt   . Heart disease Paternal Aunt   . Heart failure Paternal Grandmother   . Cancer Paternal Grandmother        Lung Cancer  . Heart disease Paternal Grandmother   . Cancer Paternal Grandfather        Liver Cancer    Social History Social History  Substance Use Topics  . Smoking status: Current Every Day Smoker    Packs/day: 1.00    Years: 27.00    Types: Cigarettes  . Smokeless tobacco: Former [employer]    Types: Chew  . Alcohol use Yes     Comment: "very rarely" "once or twice a year"     Allergies   Tramadol   Review of Systems Review of Systems  All other systems reviewed and are negative.    Physical Exam Updated Vital Signs BP 115/72 (BP Location: Left Arm)   Pulse 75   Temp 98.2 F (36.8 C) (Oral)   Resp 15   SpO2 98%   Physical Exam  Constitutional: She is oriented to person, place, and time. She appears well-developed and well-nourished.  HENT:  Head:  Normocephalic and atraumatic.  Cardiovascular: Normal rate and regular rhythm.   No murmur heard. Pulmonary/Chest: Effort normal and breath sounds normal. No respiratory distress.  Abdominal: Soft. There is no rebound and no guarding.  Mild epigastric tenderness.  Musculoskeletal: She exhibits no edema or tenderness.  Neurological: She is alert and oriented to person, place, and time.  Skin: Skin is warm and dry.  Psychiatric: She has a normal mood and affect. Her behavior is normal.  Nursing note and vitals reviewed.    ED Treatments / Results  Labs (all labs ordered are listed, but only abnormal results are displayed) Labs Reviewed  COMPREHENSIVE METABOLIC PANEL - Abnormal; Notable for the following:       Result Value   Potassium 3.2 (*)    Glucose, Bld 124 (*)    ALT 13 (*)    All other components within normal limits  RAPID URINE DRUG  SCREEN, HOSP PERFORMED - Abnormal; Notable for the following:    Opiates POSITIVE (*)    Cocaine POSITIVE (*)    Benzodiazepines POSITIVE (*)    Tetrahydrocannabinol POSITIVE (*)    All other components within normal limits  ETHANOL  CBC    EKG  EKG Interpretation None       Radiology No results found.  Procedures Procedures (including critical care time)  Medications Ordered in ED Medications  potassium chloride SA (K-DUR,KLOR-CON) CR tablet 40 mEq (not administered)  promethazine (PHENERGAN) injection 25 mg (not administered)     Initial Impression / Assessment and Plan / ED Course  I have reviewed the triage vital signs and the nursing notes.  Pertinent labs & imaging results that were available during my care of the patient were reviewed by me and considered in my medical decision making (see chart for details).     Patient with opiate addiction here seeking assistance with detox. She has mild epigastric tenderness on examination without any peritoneal findings. BMP demonstrates mild hypokalemia, will replace orally and treat her symptoms in the emergency department with antiemetic. Counseled pt on home care for opiate withdrawal. Will provide symptomatic relief with antiemetics and clonidine. Offered resources for outpatient treatment. Return precautions discussed.  Final Clinical Impressions(s) / ED Diagnoses   Final diagnoses:  Opioid dependence with withdrawal [facility])    New Prescriptions New Prescriptions   No medications on file     Tilden Fossa, MD 06/22/16 2358

## 2016-06-22 NOTE — ED Triage Notes (Signed)
Pt here for detox from opiods, xanax, and ETOH; last use today

## 2017-04-04 IMAGING — DX DG ELBOW COMPLETE 3+V*R*
4 series · 4 of 4 positions shown · non-contrast
Comparison: None.

CLINICAL DATA: RIGHT ELBOW PAIN, NO KNOWN INJURY, Pt reports right
elbow pain for approx week. HISTORY OF PTSD, DEPRESSION

EXAM:
RIGHT ELBOW - COMPLETE 3+ VIEW

[elbow ap]
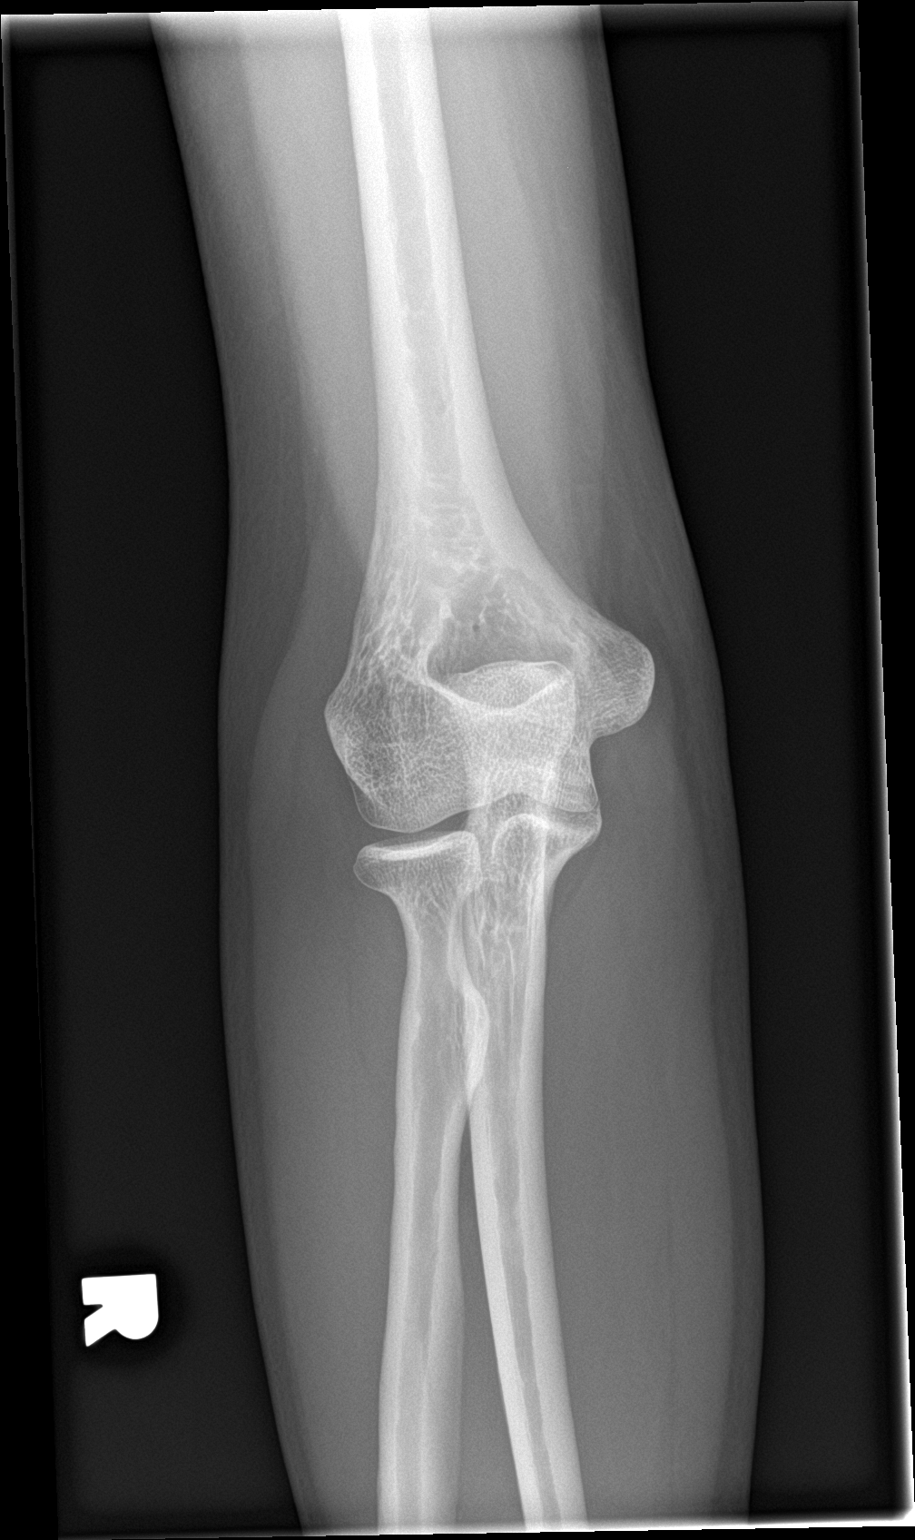

[elbow obl (1 of 2)]
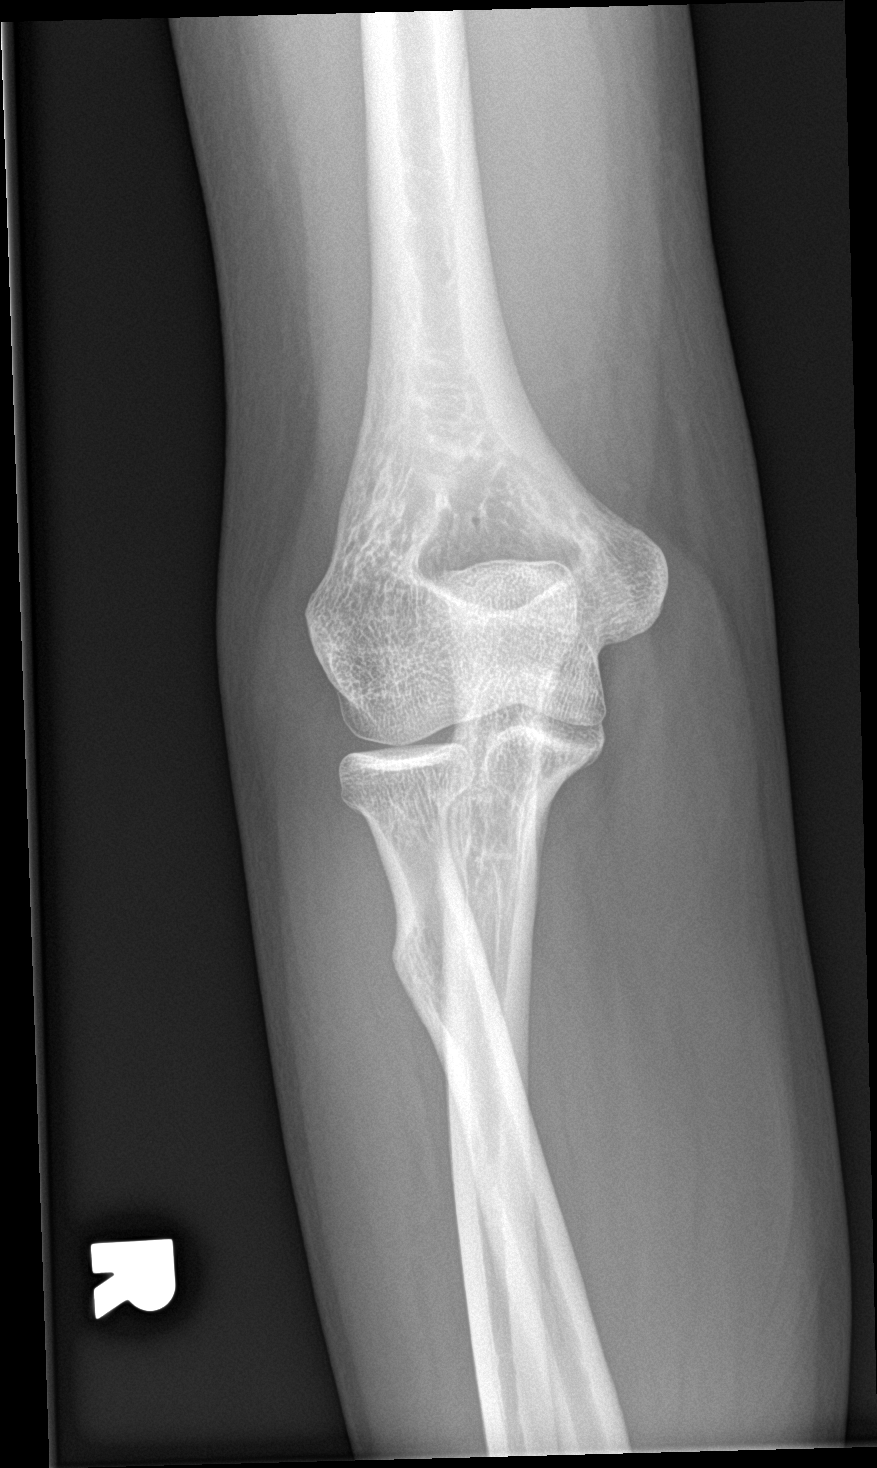

[elbow lat]
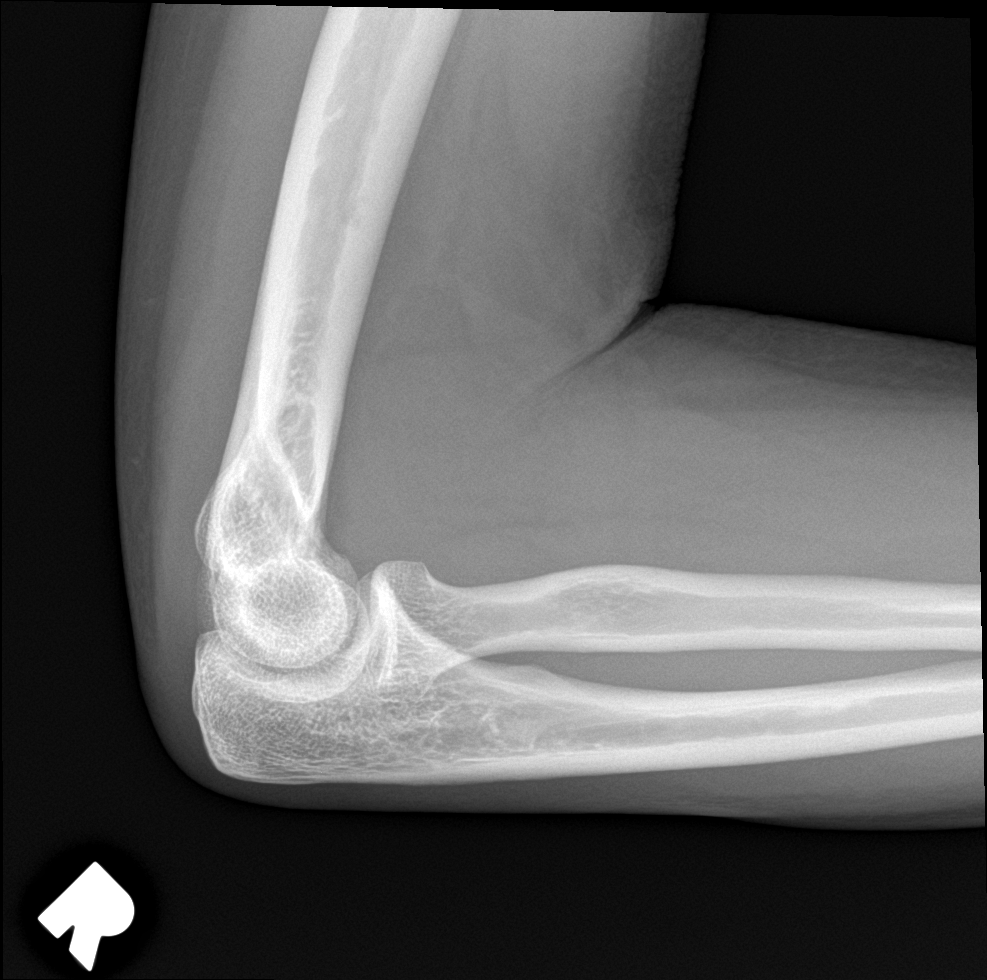

[elbow obl (2 of 2)]
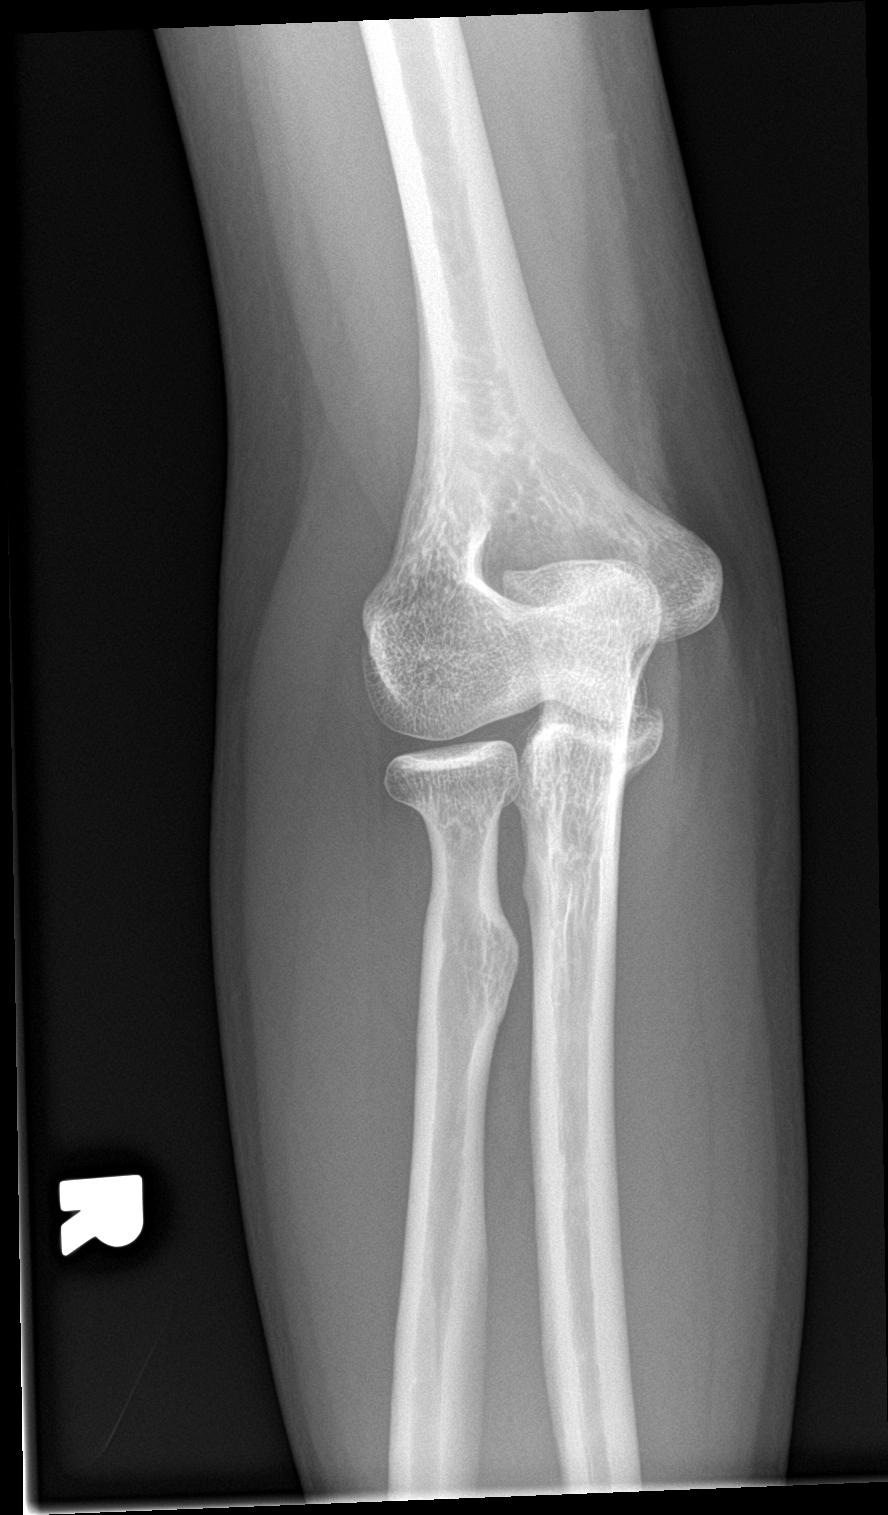

[4 of 4 positions shown; findings below may reference images not displayed]

FINDINGS: There is no evidence of fracture, dislocation, or joint effusion.
There is no evidence of arthropathy or other focal bone abnormality.
Soft tissues are unremarkable.
IMPRESSION: Negative.
